# Patient Record
Sex: Female | Born: 1970 | Race: Black or African American | Hispanic: No | Marital: Single | Smoking: Never smoker
Health system: Southern US, Community
[De-identification: ages and names within clinical notes are randomized; demographics above are authoritative.]

## PROBLEM LIST (undated history)

## (undated) DIAGNOSIS — IMO0002 Reserved for concepts with insufficient information to code with codable children: Secondary | ICD-10-CM

## (undated) DIAGNOSIS — D649 Anemia, unspecified: Secondary | ICD-10-CM

## (undated) DIAGNOSIS — M329 Systemic lupus erythematosus, unspecified: Secondary | ICD-10-CM

## (undated) DIAGNOSIS — J8410 Pulmonary fibrosis, unspecified: Secondary | ICD-10-CM

## (undated) DIAGNOSIS — I2781 Cor pulmonale (chronic): Secondary | ICD-10-CM

## (undated) DIAGNOSIS — N183 Chronic kidney disease, stage 3 unspecified: Secondary | ICD-10-CM

## (undated) DIAGNOSIS — J9610 Chronic respiratory failure, unspecified whether with hypoxia or hypercapnia: Secondary | ICD-10-CM

## (undated) DIAGNOSIS — M341 CR(E)ST syndrome: Secondary | ICD-10-CM

---

## 2011-10-24 ENCOUNTER — Emergency Department (HOSPITAL_COMMUNITY)
Admission: EM | Admit: 2011-10-24 | Discharge: 2011-10-25 | Disposition: A | Discharge status: Home / Self Care | Payer: Medicaid Other | Attending: Emergency Medicine | Admitting: Emergency Medicine

## 2011-10-24 ENCOUNTER — Encounter (HOSPITAL_COMMUNITY): Payer: Self-pay | Admitting: *Deleted

## 2011-10-24 ENCOUNTER — Other Ambulatory Visit: Payer: Self-pay

## 2011-10-24 ENCOUNTER — Emergency Department (HOSPITAL_COMMUNITY): Payer: Medicaid Other

## 2011-10-24 DIAGNOSIS — Z79899 Other long term (current) drug therapy: Secondary | ICD-10-CM | POA: Insufficient documentation

## 2011-10-24 DIAGNOSIS — IMO0001 Reserved for inherently not codable concepts without codable children: Secondary | ICD-10-CM | POA: Insufficient documentation

## 2011-10-24 DIAGNOSIS — J189 Pneumonia, unspecified organism: Secondary | ICD-10-CM | POA: Insufficient documentation

## 2011-10-24 DIAGNOSIS — M329 Systemic lupus erythematosus, unspecified: Secondary | ICD-10-CM | POA: Insufficient documentation

## 2011-10-24 DIAGNOSIS — R0602 Shortness of breath: Secondary | ICD-10-CM | POA: Insufficient documentation

## 2011-10-24 HISTORY — DX: Pulmonary fibrosis, unspecified: J84.10

## 2011-10-24 HISTORY — DX: Systemic lupus erythematosus, unspecified: M32.9

## 2011-10-24 HISTORY — DX: Reserved for concepts with insufficient information to code with codable children: IMO0002

## 2011-10-24 MED ORDER — IPRATROPIUM BROMIDE 0.02 % IN SOLN
0.5000 mg | Freq: Once | RESPIRATORY_TRACT | Status: AC
  Administered 2011-10-24: 0.5 mg via RESPIRATORY_TRACT
  Filled 2011-10-24: qty 2.5

## 2011-10-24 MED ORDER — ALBUTEROL SULFATE (5 MG/ML) 0.5% IN NEBU
5.0000 mg | INHALATION_SOLUTION | Freq: Once | RESPIRATORY_TRACT | Status: AC
  Administered 2011-10-24: 5 mg via RESPIRATORY_TRACT
  Filled 2011-10-24: qty 1

## 2011-10-24 NOTE — ED Notes (Signed)
The pt with pulmonary fibrosis  couhging for one month and she has had more sob and some intermittent chest pain for one week

## 2011-10-25 LAB — DIFFERENTIAL
Basophils Absolute: 0 10*3/uL (ref 0.0–0.1)
Basophils Relative: 0 % (ref 0–1)
Eosinophils Absolute: 0 10*3/uL (ref 0.0–0.7)
Eosinophils Relative: 0 % (ref 0–5)
Lymphocytes Relative: 18 % (ref 12–46)
Lymphs Abs: 2.1 10*3/uL (ref 0.7–4.0)
Monocytes Absolute: 0.8 10*3/uL (ref 0.1–1.0)
Monocytes Relative: 7 % (ref 3–12)
Neutro Abs: 8.8 10*3/uL — ABNORMAL HIGH (ref 1.7–7.7)
Neutrophils Relative %: 74 % (ref 43–77)

## 2011-10-25 LAB — BASIC METABOLIC PANEL
BUN: 16 mg/dL (ref 6–23)
CO2: 28 mEq/L (ref 19–32)
Calcium: 9.2 mg/dL (ref 8.4–10.5)
Chloride: 103 mEq/L (ref 96–112)
Creatinine, Ser: 0.9 mg/dL (ref 0.50–1.10)
GFR calc Af Amer: >90 mL/min (ref >90)
GFR calc non Af Amer: 78 mL/min — ABNORMAL LOW (ref >90)
Glucose, Bld: 122 mg/dL — ABNORMAL HIGH (ref 70–99)
Potassium: 3.9 mEq/L (ref 3.5–5.1)
Sodium: 141 mEq/L (ref 135–145)

## 2011-10-25 LAB — CBC
HCT: 40.1 % (ref 36.0–46.0)
Hemoglobin: 13.3 g/dL (ref 12.0–15.0)
MCH: 24.9 pg — ABNORMAL LOW (ref 26.0–34.0)
MCHC: 33.2 g/dL (ref 30.0–36.0)
MCV: 75 fL — ABNORMAL LOW (ref 78.0–100.0)
Platelets: 271 10*3/uL (ref 150–400)
RBC: 5.35 MIL/uL — ABNORMAL HIGH (ref 3.87–5.11)
RDW: 17.1 % — ABNORMAL HIGH (ref 11.5–15.5)
WBC: 11.8 10*3/uL — ABNORMAL HIGH (ref 4.0–10.5)

## 2011-10-25 LAB — TROPONIN I: Troponin I: <0.3 ng/mL (ref <0.30)

## 2011-10-25 LAB — PRO B NATRIURETIC PEPTIDE: Pro B Natriuretic peptide (BNP): 337.4 pg/mL — ABNORMAL HIGH (ref 0–125)

## 2011-10-25 MED ORDER — MOXIFLOXACIN HCL 400 MG PO TABS: 400.0000 mg | ORAL_TABLET | Freq: Every day | ORAL | Status: AC

## 2011-10-25 MED ORDER — HYDROCODONE-ACETAMINOPHEN 5-325 MG PO TABS: 2.0000 | ORAL_TABLET | ORAL | Status: AC | PRN

## 2011-10-25 MED ORDER — HYDROCODONE-ACETAMINOPHEN 5-325 MG PO TABS
1.0000 | ORAL_TABLET | Freq: Once | ORAL | Status: AC
  Administered 2011-10-25: 1 via ORAL

## 2011-10-25 MED ORDER — HYDROCOD POLST-CHLORPHEN POLST 10-8 MG/5ML PO LQCR: 5.0000 mL | Freq: Two times a day (BID) | ORAL | Status: DC | PRN

## 2011-10-25 MED ORDER — HYDROCODONE-ACETAMINOPHEN 5-325 MG PO TABS
2.0000 | ORAL_TABLET | Freq: Once | ORAL | Status: DC
  Filled 2011-10-25: qty 2

## 2011-10-25 MED ORDER — MOXIFLOXACIN HCL IN NACL 400 MG/250ML IV SOLN: 400.0000 mg | Freq: Once | INTRAVENOUS | Status: DC

## 2011-10-25 MED ORDER — SODIUM CHLORIDE 0.9 % IV SOLN
INTRAVENOUS | Status: DC
  Administered 2011-10-25: 01:00:00 via INTRAVENOUS

## 2011-10-25 MED ORDER — ANTIPYRINE-BENZOCAINE 5.4-1.4 % OT SOLN: 3.0000 [drp] | Freq: Once | OTIC | Status: DC

## 2011-10-25 MED ORDER — MOXIFLOXACIN HCL 400 MG PO TABS
ORAL_TABLET | ORAL | Status: AC
  Administered 2011-10-25: 400 mg
  Filled 2011-10-25: qty 1

## 2011-10-25 MED ORDER — HYDROCOD POLST-CHLORPHEN POLST 10-8 MG/5ML PO LQCR
5.0000 mL | Freq: Once | ORAL | Status: AC
  Administered 2011-10-25: 5 mL via ORAL
  Filled 2011-10-25: qty 5

## 2011-10-25 MED ORDER — DEXTROSE 5 % IV SOLN
1.0000 g | Freq: Once | INTRAVENOUS | Status: AC
  Administered 2011-10-25: 1 g via INTRAVENOUS
  Filled 2011-10-25: qty 10

## 2011-10-25 MED ORDER — IBUPROFEN 800 MG PO TABS
800.0000 mg | ORAL_TABLET | Freq: Once | ORAL | Status: AC
  Administered 2011-10-25: 800 mg via ORAL
  Filled 2011-10-25: qty 1

## 2011-10-25 NOTE — Discharge Instructions (Signed)
Please review the instructions below. You were seen in the emergency department tonight for your persistent cough. The chest x-ray tonight shows that you do have a pneumonia in your right lower lung. We are treating the pneumonia with an antibiotic. You will take one antibiotic tablet daily. We have also prescribed a short course of medication for cough and pain. Please take as directed. Be aware that the medication for cough and hydrocodone/APAP can make you sleepy. Avoid taking them together. Also be aware that the hydrocodone/APAP contains Tylenol. Avoid taking this medication with extra doses of Tylenol. Continue your inhalers, neb treatments and home oxygen use as previously instructed by your pulmonologist. Call today to arrange follow up with your pulmonologist in 2 days. If you're unable to arrange follow up with the pulmonologist at [facility] in 2-3 days then follow up with your primary care physician here [place]. Return for worsening symptoms otherwise follow up as discussed.   Pneumonia, Adult Pneumonia is an infection of the lungs. It may be caused by a germ (virus or bacteria). Some types of pneumonia can spread easily from person to person. This can happen when you cough or sneeze. HOME CARE  Only take medicine as told by your doctor.   Take your medicine (antibiotics) as told. Finish it even if you start to feel better.   Do not smoke.   You may use a vaporizer or humidifier in your room. This can help loosen thick spit (mucus).   Sleep so you are almost sitting up (semi-upright). This helps reduce coughing.   Rest.  A shot (vaccine) can help prevent pneumonia. Shots are often advised for:  People over 65 years old.   Patients on chemotherapy.   People with long-term (chronic) lung problems.   People with immune system problems.  GET HELP RIGHT AWAY IF:   You are getting worse.   You cannot control your cough, and you are losing sleep.   You cough up blood.   Your  pain gets worse, even with medicine.   You have a fever.   Any of your problems are getting worse, not better.   You have shortness of breath or chest pain.  MAKE SURE YOU:   Understand these instructions.   Will watch your condition.   Will get help right away if you are not doing well or get worse.  Document Released: 12/29/2007 Document Revised: 07/01/2011 Document Reviewed: 10/02/2010 [facility] Patient Information 2012 [place].Pneumonia, Adult Pneumonia is an infection of the lungs. It may be caused by a germ (virus or bacteria). Some types of pneumonia can spread easily from person to person. This can happen when you cough or sneeze. HOME CARE  Only take medicine as told by your doctor.   Take your medicine (antibiotics) as told. Finish it even if you start to feel better.   Do not smoke.   You may use a vaporizer or humidifier in your room. This can help loosen thick spit (mucus).   Sleep so you are almost sitting up (semi-upright). This helps reduce coughing.   Rest.  A shot (vaccine) can help prevent pneumonia. Shots are often advised for:  People over 89 years old.   Patients on chemotherapy.   People with long-term (chronic) lung problems.   People with immune system problems.  GET HELP RIGHT AWAY IF:   You are getting worse.   You cannot control your cough, and you are losing sleep.   You cough up blood.   Your  pain gets worse, even with medicine.   You have a fever.   Any of your problems are getting worse, not better.   You have shortness of breath or chest pain.  MAKE SURE YOU:   Understand these instructions.   Will watch your condition.   Will get help right away if you are not doing well or get worse.  Document Released: 12/29/2007 Document Revised: 07/01/2011 Document Reviewed: 10/02/2010 [facility] Patient Information 2012 [place].

## 2011-10-25 NOTE — ED Notes (Signed)
Patient with expiratory wheezing.  Patient states she has had a cough for about one month, with wheezing in the last week, getting worse tonight.  Denies any fever.  Patient with oxygen saturation of 89% on room air, placed on oxygen via [place] at 2L with increasing oxygen saturation to 100%.

## 2011-10-25 NOTE — ED Provider Notes (Signed)
History     CSN: [id number]  Arrival date & time 10/24/11  2234   First MD Initiated Contact with Patient 10/24/11 2343      Chief Complaint  Patient presents with  . Shortness of Breath    HPI: Patient is a 41 y.o. female presenting with cough. The history is provided by the patient.  Cough This is a recurrent problem. The current episode started more than 1 week ago. The problem has been gradually worsening. The cough is productive of sputum. There has been no fever. Associated symptoms include myalgias and shortness of breath. Pertinent negatives include no chills, no sweats and no weight loss. She has tried cough syrup and mist for the symptoms. The treatment provided no relief. She is not a smoker.  Pt reports 1 mo hx of increasing cough. Pt w/ hx of Lupus and Pulmonary Fibrosis and has chronic cough anyway so initially thought little of it. Over the last 3-5 days pt has had worsening cough that often results in post-tussive vomiting and related SOB. Has noted mild increase in fatigue and bodyaches but no fever. States she has coughed so often that she has persistent chest wall pain (worse w/ cough and deep inhalation). Pt admits she has been told in past that she had "enlarged heart". Her doctors at [facility] performed "lots of tests" including a cardiac cath which did not find any "heart problems". Is on home 02 at night and uses it PRN during the day. States she rarely has to use during the day. Pt states she was at [facility] all day waiting on a niece to have a baby and her coughing became so persistent she drove here for eval.  Past Medical History  Diagnosis Date  . Lupus   . Fibrosis of lung     History reviewed. No pertinent past surgical history.  History reviewed. No pertinent family history.  History  Substance Use Topics  . Smoking status: Never Smoker   . Smokeless tobacco: Not on file  . Alcohol Use: No    OB History    Grav Para Term Preterm Abortions TAB SAB Ect Mult  Living                  Review of Systems  Constitutional: Negative.  Negative for fever, chills and weight loss.  HENT: Negative.   Eyes: Negative.   Respiratory: Positive for cough and shortness of breath.   Cardiovascular: Negative.   Gastrointestinal: Negative.   Genitourinary: Negative.   Musculoskeletal: Positive for myalgias.  Skin: Negative.   Neurological: Negative.   Hematological: Negative.   Psychiatric/Behavioral: Negative.     Allergies  Review of patient's allergies indicates not on file.  Home Medications   Current Outpatient Rx  Name Route Sig Dispense Refill  . ALBUTEROL SULFATE HFA 108 (90 BASE) MCG/ACT IN AERS Inhalation Inhale 2 puffs into the lungs every 6 (six) hours as needed. For shortness of breath    . ALBUTEROL SULFATE (2.5 MG/3ML) 0.083% IN NEBU Nebulization Take 2.5 mg by nebulization every 6 (six) hours as needed. For shortness of breath    . BECLOMETHASONE DIPROPIONATE 80 MCG/ACT IN AERS Inhalation Inhale 1 puff into the lungs 2 (two) times daily as needed. For shortness of  breath    . CALCIUM CARBONATE 1250 MG PO TABS Oral Take 1 tablet by mouth 2 (two) times daily.    Marland Kitchen CETIRIZINE HCL 10 MG PO TABS Oral Take 10 mg by mouth daily.    Marland Kitchen  DESONIDE 0.05 % EX CREA Topical Apply 1 application topically 2 (two) times daily.    . FUROSEMIDE 80 MG PO TABS Oral Take 80 mg by mouth 2 (two) times daily.    Marland Kitchen HYDROCHLOROTHIAZIDE 25 MG PO TABS Oral Take 25 mg by mouth daily.    Marland Kitchen HYDROXYCHLOROQUINE SULFATE 200 MG PO TABS Oral Take by mouth 2 (two) times daily.    . IRON FORMULA PO Oral Take 1 tablet by mouth daily.    Marland Kitchen MONTELUKAST SODIUM 10 MG PO TABS Oral Take 10 mg by mouth daily.    Marland Kitchen PANTOPRAZOLE SODIUM 40 MG PO TBEC Oral Take 40 mg by mouth daily.    Marland Kitchen POTASSIUM CHLORIDE CRYS ER 20 MEQ PO TBCR Oral Take 20 mEq by mouth daily.    Marland Kitchen TACROLIMUS 0.1 % EX OINT Topical Apply 1 application topically 2 (two) times daily.    Marland Kitchen HYDROCOD POLST-CPM POLST ER 10-8  MG/5ML PO LQCR Oral Take 5 mLs by mouth every 12 (twelve) hours as needed. 30 mL 0  . HYDROCODONE-ACETAMINOPHEN 5-325 MG PO TABS Oral Take 2 tablets by mouth every 4 (four) hours as needed for pain. 10 tablet 0  . MOXIFLOXACIN HCL 400 MG PO TABS Oral Take 1 tablet (400 mg total) by mouth daily. 6 tablet 0    BP 99/60  Pulse 85  Temp(Src) 98.8 F (37.1 C) (Oral)  Resp 16  SpO2 99%  LMP 10/03/2011  Physical Exam  Constitutional: She is oriented to person, place, and time. She appears well-developed and well-nourished.  HENT:  Head: Normocephalic and atraumatic.  Eyes: Conjunctivae are normal.  Neck: Neck supple.  Cardiovascular: Normal rate and regular rhythm.   Pulmonary/Chest: Effort normal. Not tachypneic. No respiratory distress.       BBS diminished bil w/ mild exp wheezes bil  R>L.   Abdominal: Soft. Bowel sounds are normal.  Musculoskeletal: Normal range of motion.  Neurological: She is alert and oriented to person, place, and time.  Skin: Skin is warm and dry. No erythema.  Psychiatric: She has a normal mood and affect.    ED Course  Procedures   Date: 03/0312013  Rate: 95  Rhythm: normal sinus rhythm  QRS Axis: normal  Intervals: normal  ST/T Wave abnormalities: normal  Conduction Disutrbances:none  Narrative Interpretation:   Old EKG Reviewed: none available  Pt reports breathing much improved w/ neb. BBS remained diminished but w/o exp wheezes. 02 sats now 96% at rest. EKG normal, Trop I negative, CXR findings c/w RLL PNA. Pt ambulated by me w/ continuous 02 sat monitoring. During ambulation in dept pt noted w/ 02 sats at 93-94%. She tolerated ambulation w/o increased SOB or tachycardia and had rapid return to baseline 02 sat of 96% when seated.  Findings and clinical impression discussed w/ pt. Will plan for d/c home w/ Tx for CAP. Pt instructed to arrange f/u w/ her pulmonologist in 2 to 3 days. If she is unable to get in that soon she should arrange f/u w/ her  local PCP for a recheck. Pt is agreeable w/ plan.  I have discussed pt w/ Dr. Venora Maples who is agreeable w/ plan.    Labs Reviewed  CBC - Abnormal; Notable for the following:    WBC 11.8 (*)    RBC 5.35 (*)    MCV 75.0 (*)    MCH 24.9 (*)    RDW 17.1 (*)    All other components within normal limits  DIFFERENTIAL - Abnormal;  Notable for the following:    Neutro Abs 8.8 (*)    All other components within normal limits  BASIC METABOLIC PANEL - Abnormal; Notable for the following:    Glucose, Bld 122 (*)    GFR calc non Af Amer 78 (*)    All other components within normal limits  PRO B NATRIURETIC PEPTIDE - Abnormal; Notable for the following:    Pro B Natriuretic peptide (BNP) 337.4 (*)    All other components within normal limits  TROPONIN I   Dg Chest 2 View  10/24/2011  *RADIOLOGY REPORT*  Clinical Data: Persistent cough and shortness of breath for 1 week.  CHEST - 2 VIEW  Comparison: None.  Findings: Mild cardiac enlargement with relatively normal pulmonary vascularity.  There is suggestion of focal infiltration in the right lung base which might represent early pneumonia or edema.  No blunting of costophrenic angles.  No pneumothorax.  IMPRESSION: Focal airspace infiltration in the right lung base suggesting pneumonia or edema.  Mild cardiac enlargement.  Original Report Authenticated By: Neale Burly, M.D.     1. Community acquired pneumonia       MDM  HPI/PE and clinical findings c/w 1. CAP (Pt w/ chronic lung disease (pulmonary fibrosis) and Lupus w/  increased cough x 1 mo, worse over 3-4 days. No fever.  Pt initially mildly tachycardic (111)  w/ 02 sat of 95%. Tachycardia resolved and 02 sats improved after one albuterol/atrovent neb. PE considered but not likely given pt's improved status after neb and clear source for resp symptoms. EKG normal and Trop I negative. Pt reports feeling better after neb and is agreeable w/ discharge plan.         Jeryl Columbia, NP 10/27/11 [phone]

## 2011-10-25 NOTE — ED Notes (Signed)
Pt given RX for Avelox, per pharmacy not covered by insurance. RX changed to Levaquin 750 mg PO daily x five day by Charlotte Sanes PA. RX called to [facility].

## 2011-10-27 NOTE — ED Provider Notes (Signed)
Medical screening examination/treatment/procedure(s) were conducted as a shared visit with non-physician practitioner(s) and myself.  I personally evaluated the patient during the encounter   Hoy Morn, MD 10/27/11 2158

## 2013-12-13 ENCOUNTER — Ambulatory Visit: Payer: Medicaid Other | Attending: Family Medicine | Admitting: Physical Therapy

## 2013-12-13 DIAGNOSIS — M329 Systemic lupus erythematosus, unspecified: Secondary | ICD-10-CM | POA: Insufficient documentation

## 2013-12-13 DIAGNOSIS — J8410 Pulmonary fibrosis, unspecified: Secondary | ICD-10-CM | POA: Diagnosis not present

## 2013-12-13 DIAGNOSIS — M6281 Muscle weakness (generalized): Secondary | ICD-10-CM | POA: Insufficient documentation

## 2013-12-13 DIAGNOSIS — IMO0001 Reserved for inherently not codable concepts without codable children: Secondary | ICD-10-CM | POA: Insufficient documentation

## 2015-01-11 ENCOUNTER — Emergency Department (HOSPITAL_BASED_OUTPATIENT_CLINIC_OR_DEPARTMENT_OTHER)
Admission: EM | Admit: 2015-01-11 | Discharge: 2015-01-12 | Disposition: A | Discharge status: Home / Self Care | Payer: Medicaid Other | Attending: Emergency Medicine | Admitting: Emergency Medicine

## 2015-01-11 ENCOUNTER — Encounter (HOSPITAL_BASED_OUTPATIENT_CLINIC_OR_DEPARTMENT_OTHER): Payer: Self-pay | Admitting: Emergency Medicine

## 2015-01-11 DIAGNOSIS — R0600 Dyspnea, unspecified: Secondary | ICD-10-CM | POA: Diagnosis not present

## 2015-01-11 DIAGNOSIS — R202 Paresthesia of skin: Secondary | ICD-10-CM | POA: Insufficient documentation

## 2015-01-11 DIAGNOSIS — Z8709 Personal history of other diseases of the respiratory system: Secondary | ICD-10-CM

## 2015-01-11 DIAGNOSIS — R0602 Shortness of breath: Secondary | ICD-10-CM | POA: Insufficient documentation

## 2015-01-11 DIAGNOSIS — Z7952 Long term (current) use of systemic steroids: Secondary | ICD-10-CM | POA: Diagnosis not present

## 2015-01-11 DIAGNOSIS — R200 Anesthesia of skin: Secondary | ICD-10-CM | POA: Diagnosis not present

## 2015-01-11 DIAGNOSIS — M7989 Other specified soft tissue disorders: Secondary | ICD-10-CM | POA: Insufficient documentation

## 2015-01-11 DIAGNOSIS — R600 Localized edema: Secondary | ICD-10-CM | POA: Diagnosis not present

## 2015-01-11 DIAGNOSIS — M791 Myalgia: Secondary | ICD-10-CM | POA: Insufficient documentation

## 2015-01-11 DIAGNOSIS — Z8739 Personal history of other diseases of the musculoskeletal system and connective tissue: Secondary | ICD-10-CM | POA: Diagnosis not present

## 2015-01-11 DIAGNOSIS — R29810 Facial weakness: Secondary | ICD-10-CM | POA: Diagnosis present

## 2015-01-11 DIAGNOSIS — Z79899 Other long term (current) drug therapy: Secondary | ICD-10-CM | POA: Insufficient documentation

## 2015-01-11 NOTE — ED Notes (Signed)
Pt states facial droop and swelling to right side of face and states that her face "just twists up and goes from side to side, sometimes its the left side of my face."  Pt also states tonight she is having right arm pain and that her muscles were clenching into a fist in her right hand, she would open the fist, then it would want to close again.

## 2015-01-12 ENCOUNTER — Emergency Department (HOSPITAL_BASED_OUTPATIENT_CLINIC_OR_DEPARTMENT_OTHER): Payer: Medicaid Other

## 2015-01-12 LAB — COMPREHENSIVE METABOLIC PANEL
ALT: 16 U/L (ref 14–54)
ANION GAP: 14 (ref 5–15)
AST: 24 U/L (ref 15–41)
Albumin: 4 g/dL (ref 3.5–5.0)
Alkaline Phosphatase: 45 U/L (ref 38–126)
BILIRUBIN TOTAL: 0.5 mg/dL (ref 0.3–1.2)
BUN: 33 mg/dL — AB (ref 6–20)
CHLORIDE: 97 mmol/L — AB (ref 101–111)
CO2: 22 mmol/L (ref 22–32)
Calcium: 9.3 mg/dL (ref 8.9–10.3)
Creatinine, Ser: 1.44 mg/dL — ABNORMAL HIGH (ref 0.44–1.00)
GFR calc Af Amer: 50 mL/min — ABNORMAL LOW (ref >60)
GFR calc non Af Amer: 43 mL/min — ABNORMAL LOW (ref >60)
Glucose, Bld: 128 mg/dL — ABNORMAL HIGH (ref 65–99)
Potassium: 5.4 mmol/L — ABNORMAL HIGH (ref 3.5–5.1)
Sodium: 133 mmol/L — ABNORMAL LOW (ref 135–145)
TOTAL PROTEIN: 7.6 g/dL (ref 6.5–8.1)

## 2015-01-12 LAB — CBC WITH DIFFERENTIAL/PLATELET
Basophils Absolute: 0 K/uL (ref 0.0–0.1)
Basophils Relative: 0 % (ref 0–1)
Eosinophils Absolute: 0.1 K/uL (ref 0.0–0.7)
Eosinophils Relative: 1 % (ref 0–5)
HCT: 32.7 % — ABNORMAL LOW (ref 36.0–46.0)
Hemoglobin: 10 g/dL — ABNORMAL LOW (ref 12.0–15.0)
Lymphocytes Relative: 12 % (ref 12–46)
Lymphs Abs: 1.3 K/uL (ref 0.7–4.0)
MCH: 24.4 pg — ABNORMAL LOW (ref 26.0–34.0)
MCHC: 30.6 g/dL (ref 30.0–36.0)
MCV: 79.8 fL (ref 78.0–100.0)
Monocytes Absolute: 1.1 K/uL — ABNORMAL HIGH (ref 0.1–1.0)
Monocytes Relative: 10 % (ref 3–12)
Neutro Abs: 8.4 K/uL — ABNORMAL HIGH (ref 1.7–7.7)
Neutrophils Relative %: 77 % (ref 43–77)
Platelets: 416 K/uL — ABNORMAL HIGH (ref 150–400)
RBC: 4.1 MIL/uL (ref 3.87–5.11)
RDW: 17.6 % — ABNORMAL HIGH (ref 11.5–15.5)
WBC: 11 K/uL — ABNORMAL HIGH (ref 4.0–10.5)

## 2015-01-12 LAB — TROPONIN I: Troponin I: <0.03 ng/mL (ref <0.031)

## 2015-01-12 LAB — BRAIN NATRIURETIC PEPTIDE: B Natriuretic Peptide: 255.7 pg/mL — ABNORMAL HIGH (ref 0.0–100.0)

## 2015-01-12 NOTE — ED Provider Notes (Signed)
CSN: [id number]     Arrival date & time 01/11/15  2339 History  This chart was scribed for Christine Speak, MD by Meriel Pica, ED Scribe. This patient was seen in room MH05/MH05 and the patient's care was started 12:17 AM.  Chief Complaint  Patient presents with  . Facial Droop   HPI HPI Comments: Christine Khan is a 44 y.o. female, with a Pmhx of Lupus, CHF, and Pulmonary Fibrosis, who presents to the Emergency Department complaining of gradually worsening, constant, moderate numbness and twitching of her right-sided face and mouth that has been present but worsened today. She also notes that her right hand will clench and lock up and she has to keep prying it open. She additionally complains of numbness in her legs and feet bilaterally and increased swelling in her BLE. Per family members she has had increased SOB onset tonight. She denies feeling all symptoms similar in the past. She recently started a new breathing medication since 5/19. She denies a history of renal failure.    Past Medical History  Diagnosis Date  . Lupus   . Fibrosis of lung    History reviewed. No pertinent past surgical history. History reviewed. No pertinent family history. History  Substance Use Topics  . Smoking status: Never Smoker   . Smokeless tobacco: Not on file  . Alcohol Use: No   OB History    No data available     Review of Systems  Respiratory: Positive for shortness of breath.   Cardiovascular: Positive for leg swelling.  Musculoskeletal: Positive for myalgias.  Neurological: Positive for numbness.   A complete 10 system review of systems was obtained and is otherwise negative except at noted in the HPI and PMH.   Allergies  Heparin  Home Medications   Prior to Admission medications   Medication Sig Start Date End Date Taking? Authorizing Provider  albuterol (PROVENTIL HFA;VENTOLIN HFA) 108 (90 BASE) MCG/ACT inhaler Inhale 2 puffs into the lungs every 6 (six) hours as needed. For  shortness of breath    Historical Provider, MD  albuterol (PROVENTIL) (2.5 MG/3ML) 0.083% nebulizer solution Take 2.5 mg by nebulization every 6 (six) hours as needed. For shortness of breath    Historical Provider, MD  beclomethasone (QVAR) 80 MCG/ACT inhaler Inhale 1 puff into the lungs 2 (two) times daily as needed. For shortness of  breath    Historical Provider, MD  calcium carbonate (CALCIUM 500) 1250 MG tablet Take 1 tablet by mouth 2 (two) times daily.    Historical Provider, MD  cetirizine (ZYRTEC) 10 MG tablet Take 10 mg by mouth daily.    Historical Provider, MD  chlorpheniramine-HYDROcodone (TUSSIONEX PENNKINETIC ER) 10-8 MG/5ML LQCR Take 5 mLs by mouth every 12 (twelve) hours as needed. 10/25/11   Rhetta Mura Schorr, NP  desonide (DESOWEN) 0.05 % cream Apply 1 application topically 2 (two) times daily.    Historical Provider, MD  furosemide (LASIX) 80 MG tablet Take 80 mg by mouth 2 (two) times daily.    Historical Provider, MD  hydrochlorothiazide (HYDRODIURIL) 25 MG tablet Take 25 mg by mouth daily.    Historical Provider, MD  hydroxychloroquine (PLAQUENIL) 200 MG tablet Take by mouth 2 (two) times daily.    Historical Provider, MD  Iron-Folic Acid-Vit G81 (IRON FORMULA PO) Take 1 tablet by mouth daily.    Historical Provider, MD  montelukast (SINGULAIR) 10 MG tablet Take 10 mg by mouth daily.    Historical Provider, MD  pantoprazole (PROTONIX) 40 MG  tablet Take 40 mg by mouth daily.    Historical Provider, MD  potassium chloride SA (K-DUR,KLOR-CON) 20 MEQ tablet Take 20 mEq by mouth daily.    Historical Provider, MD  tacrolimus (PROTOPIC) 0.1 % ointment Apply 1 application topically 2 (two) times daily.    Historical Provider, MD   BP 111/90 mmHg  Pulse 91  Temp(Src) 98.4 F (36.9 C) (Oral)  Resp 20  Ht _0  (1.6 m)  Wt 281 lb (127.461 kg)  BMI 49.79 kg/m2  SpO2 100% Physical Exam  Constitutional: She is oriented to person, place, and time. She appears well-developed and  well-nourished. No distress.  HENT:  Head: Normocephalic.  Right Ear: External ear normal.  Left Ear: External ear normal.  Mouth/Throat: No oropharyngeal exudate.  Eyes: Pupils are equal, round, and reactive to light. Right eye exhibits no discharge. Left eye exhibits no discharge. No scleral icterus.  Neck: No JVD present.  Cardiovascular: Normal rate, regular rhythm and normal heart sounds.   Pulmonary/Chest: Effort normal and breath sounds normal. No respiratory distress.  Musculoskeletal: She exhibits edema. She exhibits no tenderness.  Lymphadenopathy:    She has no cervical adenopathy.  Neurological: She is alert and oriented to person, place, and time. No cranial nerve deficit. Coordination normal.  Skin: Skin is warm. No rash noted. No erythema. No pallor.  Psychiatric: She has a normal mood and affect. Her behavior is normal.  Nursing note and vitals reviewed.   ED Course  Procedures  DIAGNOSTIC STUDIES: Oxygen Saturation is 100% on RA, normal by my interpretation.    COORDINATION OF CARE: 12:24 AM Discussed treatment plan which includes to order diagnostic labs with pt. Pt acknowledges and agrees to plan.   Labs Review Labs Reviewed - No data to display  Imaging Review No results found.   EKG Interpretation   Date/Time:  Sunday January 12 2015 02:06:48 EDT Ventricular Rate:  107 PR Interval:  170 QRS Duration: 78 QT Interval:  332 QTC Calculation: 443 R Axis:   96 Text Interpretation:  Sinus tachycardia Possible Left atrial enlargement  Possible Right ventricular hypertrophy Abnormal ECG Confirmed by Dquan Cortopassi  MD,  Assunta Pupo ([id number]) on 01/12/2015 2:13:52 AM      MDM   Final diagnoses:  None    Patient presents here with numbness around her mouth, spasms in her hand, and tingling in her legs. Her symptoms sound much like anxiety, however she does have multiple comorbidities including lupus and pulmonary fibrosis. The workup shows essentially unremarkable  electrolytes, mildly elevated BNP, unchanged EKG, negative troponin, and chest x-ray that is consistent with pulmonary fibrosis. At this point I see no emergent issues that require hospitalization or intervention. I will advise the patient to follow-up with her primary Dr. and give this situation time to improve on its own. If she worsens, she understands to return.  I personally performed the services described in this documentation, which was scribed in my presence. The recorded information has been reviewed and is accurate.      Christine Speak, MD 01/12/15 [phone]

## 2015-01-12 NOTE — Discharge Instructions (Signed)
Follow-up with your primary Dr. in the next 2-3 days, and return to the ER if symptoms significantly worsen or change.   Paresthesia Paresthesia is an abnormal burning or prickling sensation. This sensation is generally felt in the hands, arms, legs, or feet. However, it may occur in any part of the body. It is usually not painful. The feeling may be described as:  Tingling or numbness.  "Pins and needles."  Skin crawling.  Buzzing.  Limbs "falling asleep."  Itching. Most people experience temporary (transient) paresthesia at some time in their lives. CAUSES  Paresthesia may occur when you breathe too quickly (hyperventilation). It can also occur without any apparent cause. Commonly, paresthesia occurs when pressure is placed on a nerve. The feeling quickly goes away once the pressure is removed. For some people, however, paresthesia is a long-lasting (chronic) condition caused by an underlying disorder. The underlying disorder may be:  A traumatic, direct injury to nerves. Examples include a:  Broken (fractured) neck.  Fractured skull.  A disorder affecting the brain and spinal cord (central nervous system). Examples include:  Transverse myelitis.  Encephalitis.  Transient ischemic attack.  Multiple sclerosis.  Stroke.  Tumor or blood vessel problems, such as an arteriovenous malformation pressing against the brain or spinal cord.  A condition that damages the peripheral nerves (peripheral neuropathy). Peripheral nerves are not part of the brain and spinal cord. These conditions include:  Diabetes.  Peripheral vascular disease.  Nerve entrapment syndromes, such as carpal tunnel syndrome.  Shingles.  Hypothyroidism.  Vitamin B12 deficiencies.  Alcoholism.  Heavy metal poisoning (lead, arsenic).  Rheumatoid arthritis.  Systemic lupus erythematosus. DIAGNOSIS  Your caregiver will attempt to find the underlying cause of your paresthesia. Your caregiver  may:  Take your medical history.  Perform a physical exam.  Order various lab tests.  Order imaging tests. TREATMENT  Treatment for paresthesia depends on the underlying cause. HOME CARE INSTRUCTIONS  Avoid drinking alcohol.  You may consider massage or acupuncture to help relieve your symptoms.  Keep all follow-up appointments as directed by your caregiver. SEEK IMMEDIATE MEDICAL CARE IF:   You feel weak.  You have trouble walking or moving.  You have problems with speech or vision.  You feel confused.  You cannot control your bladder or bowel movements.  You feel numbness after an injury.  You faint.  Your burning or prickling feeling gets worse when walking.  You have pain, cramps, or dizziness.  You develop a rash. MAKE SURE YOU:  Understand these instructions.  Will watch your condition.  Will get help right away if you are not doing well or get worse. Document Released: 07/02/2002 Document Revised: 10/04/2011 Document Reviewed: 04/02/2011 [facility] Patient Information 2015 [place], [place]. This information is not intended to replace advice given to you by your health care provider. Make sure you discuss any questions you have with your health care provider.

## 2015-02-23 ENCOUNTER — Emergency Department (HOSPITAL_BASED_OUTPATIENT_CLINIC_OR_DEPARTMENT_OTHER): Payer: Medicaid Other

## 2015-02-23 ENCOUNTER — Emergency Department (HOSPITAL_BASED_OUTPATIENT_CLINIC_OR_DEPARTMENT_OTHER)
Admission: EM | Admit: 2015-02-23 | Discharge: 2015-02-24 | Disposition: A | Discharge status: Home / Self Care | Payer: Medicaid Other | Attending: Emergency Medicine | Admitting: Emergency Medicine

## 2015-02-23 ENCOUNTER — Encounter (HOSPITAL_BASED_OUTPATIENT_CLINIC_OR_DEPARTMENT_OTHER): Payer: Self-pay | Admitting: *Deleted

## 2015-02-23 DIAGNOSIS — R0600 Dyspnea, unspecified: Secondary | ICD-10-CM | POA: Diagnosis not present

## 2015-02-23 DIAGNOSIS — Z7952 Long term (current) use of systemic steroids: Secondary | ICD-10-CM | POA: Diagnosis not present

## 2015-02-23 DIAGNOSIS — R000 Tachycardia, unspecified: Secondary | ICD-10-CM | POA: Diagnosis not present

## 2015-02-23 DIAGNOSIS — Z86018 Personal history of other benign neoplasm: Secondary | ICD-10-CM | POA: Insufficient documentation

## 2015-02-23 DIAGNOSIS — N390 Urinary tract infection, site not specified: Secondary | ICD-10-CM | POA: Diagnosis not present

## 2015-02-23 DIAGNOSIS — M791 Myalgia: Secondary | ICD-10-CM | POA: Diagnosis not present

## 2015-02-23 DIAGNOSIS — M2550 Pain in unspecified joint: Secondary | ICD-10-CM | POA: Diagnosis not present

## 2015-02-23 DIAGNOSIS — R531 Weakness: Secondary | ICD-10-CM | POA: Insufficient documentation

## 2015-02-23 DIAGNOSIS — R197 Diarrhea, unspecified: Secondary | ICD-10-CM | POA: Insufficient documentation

## 2015-02-23 DIAGNOSIS — R0602 Shortness of breath: Secondary | ICD-10-CM | POA: Insufficient documentation

## 2015-02-23 DIAGNOSIS — Z79899 Other long term (current) drug therapy: Secondary | ICD-10-CM | POA: Diagnosis not present

## 2015-02-23 LAB — CBC
HEMATOCRIT: 29.2 % — AB (ref 36.0–46.0)
HEMOGLOBIN: 8.8 g/dL — AB (ref 12.0–15.0)
MCH: 23.4 pg — ABNORMAL LOW (ref 26.0–34.0)
MCHC: 30.1 g/dL (ref 30.0–36.0)
MCV: 77.7 fL — AB (ref 78.0–100.0)
PLATELETS: 389 10*3/uL (ref 150–400)
RBC: 3.76 MIL/uL — AB (ref 3.87–5.11)
RDW: 18.6 % — ABNORMAL HIGH (ref 11.5–15.5)
WBC: 9.8 10*3/uL (ref 4.0–10.5)

## 2015-02-23 LAB — I-STAT CHEM 8, ED
BUN: 25 mg/dL — AB (ref 6–20)
CHLORIDE: 98 mmol/L — AB (ref 101–111)
CREATININE: 1.3 mg/dL — AB (ref 0.44–1.00)
Calcium, Ion: 1.13 mmol/L (ref 1.12–1.23)
Glucose, Bld: 91 mg/dL (ref 65–99)
HCT: 32 % — ABNORMAL LOW (ref 36.0–46.0)
Hemoglobin: 10.9 g/dL — ABNORMAL LOW (ref 12.0–15.0)
Potassium: 4.9 mmol/L (ref 3.5–5.1)
Sodium: 131 mmol/L — ABNORMAL LOW (ref 135–145)
TCO2: 23 mmol/L (ref 0–100)

## 2015-02-23 LAB — URINE MICROSCOPIC-ADD ON

## 2015-02-23 LAB — URINALYSIS, ROUTINE W REFLEX MICROSCOPIC
Bilirubin Urine: NEGATIVE
Glucose, UA: NEGATIVE mg/dL
HGB URINE DIPSTICK: NEGATIVE
Ketones, ur: NEGATIVE mg/dL
NITRITE: NEGATIVE
PH: 6 (ref 5.0–8.0)
Protein, ur: NEGATIVE mg/dL
Specific Gravity, Urine: 1.014 (ref 1.005–1.030)
Urobilinogen, UA: 0.2 mg/dL (ref 0.0–1.0)

## 2015-02-23 LAB — TROPONIN I: Troponin I: <0.03 ng/mL (ref <0.031)

## 2015-02-23 LAB — D-DIMER, QUANTITATIVE: D-Dimer, Quant: 0.83 ug/mL-FEU — ABNORMAL HIGH (ref 0.00–0.48)

## 2015-02-23 MED ORDER — IOHEXOL 350 MG/ML SOLN
100.0000 mL | Freq: Once | INTRAVENOUS | Status: AC | PRN
Start: 2015-02-23 — End: 2015-02-23
  Administered 2015-02-23: 100 mL via INTRAVENOUS

## 2015-02-23 MED ORDER — SODIUM CHLORIDE 0.9 % IV BOLUS (SEPSIS)
500.0000 mL | Freq: Once | INTRAVENOUS | Status: AC
  Administered 2015-02-23: 500 mL via INTRAVENOUS

## 2015-02-23 MED ORDER — MORPHINE SULFATE 4 MG/ML IJ SOLN
4.0000 mg | Freq: Once | INTRAMUSCULAR | Status: AC
  Administered 2015-02-23: 4 mg via INTRAVENOUS
  Filled 2015-02-23: qty 1

## 2015-02-23 NOTE — ED Notes (Signed)
Patients glucose was checked with i-stat chem 8. Notified RN Keli.

## 2015-02-23 NOTE — ED Notes (Signed)
Pt reports SOB, generalized body aches, diarrhea x 1 week.  Pt appears very weak and tired.

## 2015-02-23 NOTE — ED Provider Notes (Signed)
CSN: [id number]     Arrival date & time 02/23/15  2053 History  This chart was scribed for Elnora Morrison, MD by Helane Gunther, ED Scribe. This patient was seen in room MH03/MH03 and the patient's care was started at 10:27 PM.    Chief Complaint  Patient presents with  . Weakness   The history is provided by the patient. No language interpreter was used.   HPI Comments: Christine Khan is a 44 y.o. female who presents to the Emergency Department complaining of weakness onset 1 week ago. She reports associated SOB, myalgia, diarrhea, nose bleed, nausea, visual disturbances, and chills. She has not left her bed all week. She is on oxygen at home. She has a PMHx of Lupus and fibrosis of the lung, diagnosed 10 years ago and most likely due to the lupus. She denies PMHx of PE. She is not on blood thinners. She has not taken antibiotics recently. Pt denies vomiting, bloody stool, HA, and fever.   At [facility], she has seen Dr Randol Kern for Lupus and Dr Susette Racer for her pulmonary issues.  Past Medical History  Diagnosis Date  . Lupus   . Fibrosis of lung    History reviewed. No pertinent past surgical history. History reviewed. No pertinent family history. History  Substance Use Topics  . Smoking status: Never Smoker   . Smokeless tobacco: Not on file  . Alcohol Use: No   OB History    No data available     Review of Systems  Constitutional: Positive for chills. Negative for fever.  HENT: Negative for congestion.   Eyes: Negative for photophobia.  Respiratory: Positive for shortness of breath. Negative for cough.   Gastrointestinal: Positive for nausea and diarrhea.  Genitourinary: Positive for frequency.  Musculoskeletal: Positive for myalgias and arthralgias.  Neurological: Positive for weakness.  All other systems reviewed and are negative.  Allergies  Heparin  Home Medications   Prior to Admission medications   Medication Sig Start Date End Date Taking? Authorizing Provider   UNKNOWN TO PATIENT    Yes Historical Provider, MD  albuterol (PROVENTIL HFA;VENTOLIN HFA) 108 (90 BASE) MCG/ACT inhaler Inhale 2 puffs into the lungs every 6 (six) hours as needed. For shortness of breath    Historical Provider, MD  albuterol (PROVENTIL) (2.5 MG/3ML) 0.083% nebulizer solution Take 2.5 mg by nebulization every 6 (six) hours as needed. For shortness of breath    Historical Provider, MD  beclomethasone (QVAR) 80 MCG/ACT inhaler Inhale 1 puff into the lungs 2 (two) times daily as needed. For shortness of  breath    Historical Provider, MD  calcium carbonate (CALCIUM 500) 1250 MG tablet Take 1 tablet by mouth 2 (two) times daily.    Historical Provider, MD  cephALEXin (KEFLEX) 500 MG capsule Take 1 capsule (500 mg total) by mouth 2 (two) times daily. 02/24/15   Elnora Morrison, MD  cetirizine (ZYRTEC) 10 MG tablet Take 10 mg by mouth daily.    Historical Provider, MD  desonide (DESOWEN) 0.05 % cream Apply 1 application topically 2 (two) times daily.    Historical Provider, MD  furosemide (LASIX) 80 MG tablet Take 80 mg by mouth 2 (two) times daily.    Historical Provider, MD  hydrochlorothiazide (HYDRODIURIL) 25 MG tablet Take 25 mg by mouth daily.    Historical Provider, MD  hydroxychloroquine (PLAQUENIL) 200 MG tablet Take by mouth 2 (two) times daily.    Historical Provider, MD  Iron-Folic Acid-Vit G38 (IRON FORMULA PO) Take 1 tablet  by mouth daily.    Historical Provider, MD  montelukast (SINGULAIR) 10 MG tablet Take 10 mg by mouth daily.    Historical Provider, MD  pantoprazole (PROTONIX) 40 MG tablet Take 40 mg by mouth daily.    Historical Provider, MD  potassium chloride SA (K-DUR,KLOR-CON) 20 MEQ tablet Take 20 mEq by mouth daily.    Historical Provider, MD  tacrolimus (PROTOPIC) 0.1 % ointment Apply 1 application topically 2 (two) times daily.    Historical Provider, MD   BP 122/71 mmHg  Pulse 99  Temp(Src) 98.9 F (37.2 C) (Oral)  Resp 20  Ht _0  (1.6 m)  Wt 285 lb  (129.275 kg)  BMI 50.50 kg/m2  SpO2 91% Physical Exam  Constitutional: She is oriented to person, place, and time. She appears well-developed and well-nourished. No distress (deconditioned).  HENT:  Head: Normocephalic and atraumatic.  Mildly dry mucous membranes  Eyes: Conjunctivae and EOM are normal. Pupils are equal, round, and reactive to light.  Neck: Normal range of motion. Neck supple. No tracheal deviation present.  Cardiovascular: Regular rhythm.  Tachycardia present.   Borderline tachycardia  Pulmonary/Chest: Effort normal and breath sounds normal. No respiratory distress. She has no wheezes.  Few crackles at the base of the lungs  Abdominal: Soft. There is no tenderness.  No peritonitis of the abdomen  Musculoskeletal: Normal range of motion.  Bilateral swelling in the legs.  Neurological: She is alert and oriented to person, place, and time. No cranial nerve deficit.  Skin: Skin is warm and dry.  Psychiatric: She has a normal mood and affect. Her behavior is normal.  Nursing note and vitals reviewed.   ED Course  Procedures  DIAGNOSTIC STUDIES: Oxygen Saturation is 93% on 6L/min, low by my interpretation.    COORDINATION OF CARE: 10:35 PM - Discussed abnormal EKG. Discussed plans to order diagnostic studies and imaging. Pt advised of plan for treatment and pt agrees.  Labs Review Labs Reviewed  CBC - Abnormal; Notable for the following:    RBC 3.76 (*)    Hemoglobin 8.8 (*)    HCT 29.2 (*)    MCV 77.7 (*)    MCH 23.4 (*)    RDW 18.6 (*)    All other components within normal limits  URINALYSIS, ROUTINE W REFLEX MICROSCOPIC (NOT AT [facility]) - Abnormal; Notable for the following:    Leukocytes, UA SMALL (*)    All other components within normal limits  D-DIMER, QUANTITATIVE (NOT AT [facility]) - Abnormal; Notable for the following:    D-Dimer, Quant 0.83 (*)    All other components within normal limits  URINE MICROSCOPIC-ADD ON - Abnormal; Notable for the following:     Bacteria, UA MANY (*)    All other components within normal limits  I-STAT CHEM 8, ED - Abnormal; Notable for the following:    Sodium 131 (*)    Chloride 98 (*)    BUN 25 (*)    Creatinine, Ser 1.30 (*)    Hemoglobin 10.9 (*)    HCT 32.0 (*)    All other components within normal limits  URINE CULTURE  TROPONIN I  CBG MONITORING, ED  POC OCCULT BLOOD, ED    Imaging Review Dg Chest 2 View  02/23/2015   CLINICAL DATA:  Dyspnea, myalgias, weakness.  One week duration.  EXAM: CHEST  2 VIEW  COMPARISON:  01/12/2015  FINDINGS: Chronic interstitial coarsening persists without significant change. No superimposed airspace consolidation is evident. Is no effusion. There  is moderate unchanged cardiomegaly.  IMPRESSION: Diffuse interstitial coarsening without significant interval change. Moderate cardiomegaly, unchanged.   Electronically Signed   By: Andreas Newport M.D.   On: 02/23/2015 22:56   Ct Angio Chest Pe W/cm &/or Wo Cm  02/23/2015   CLINICAL DATA:  Dyspnea. Weakness, shortness of breath, myalgias, nausea and chills for 1 week. History of lupus and fibrosis.  EXAM: CT ANGIOGRAPHY CHEST WITH CONTRAST  TECHNIQUE: Multidetector CT imaging of the chest was performed using the standard protocol during bolus administration of intravenous contrast. Multiplanar CT image reconstructions and MIPs were obtained to evaluate the vascular anatomy.  CONTRAST:  152m OMNIPAQUE IOHEXOL 350 MG/ML SOLN  COMPARISON:  Radiographs earlier this day.  FINDINGS: There are no filling defects within the pulmonary arteries to suggest pulmonary embolus.  The thoracic aorta is normal in caliber without evidence of dissection. There is moderate multi chamber cardiomegaly physiologic pericardial fluid without effusion. No pleural effusion.  There is mild mediastinal adenopathy for example lower paratracheal lymph node short axis dimension of 1.7 cm. Multiple enlarged upper paratracheal lymph nodes. No hilar adenopathy.   Subpleural reticulation with bibasilar bronchiectasis, with mild bronchiectasis in right middle lobe. Subpleural fibrosis in the upper lobes. No septal thickening for findings to suggest pulmonary edema. Generally diffusely increased attenuation throughout the lung parenchyma. No confluent airspace disease. Scattered granulomas in the right lower lobe.  Patulous esophagus.  No esophageal wall thickening.  No acute abnormality in the included upper abdomen. Liver appears prominent in size.  There are no acute or suspicious osseous abnormalities.  Review of the MIP images confirms the above findings.  IMPRESSION: 1. No pulmonary embolus. 2. Peripheral fibrosis with bronchiectasis in the lower lobes and right middle lobe consistent with interstitial lung disease. Suggestion underlying diffuse ground-glass opacity throughout the lung parenchyma, also likely related to interstitial lung disease. 3. Cardiomegaly.   Electronically Signed   By: MJeb LeveringM.D.   On: 02/23/2015 23:57     EKG Interpretation None     EKG reviewed heart rate 98, right axis deviation, T-wave changes anterior mild worse than previous, no acute ST elevation, sinus MDM   Final diagnoses:  Diarrhea  General weakness  Dyspnea  UTI (lower urinary tract infection)  Hyponatremia  Patient with complicated medical history mostly secondary to lupus presents with clinical concern for dehydration/general weakness from recurrent diarrhea, no recent antibiotics.  Patient does have mild worsening shortness of breath and less activity than normal. Patient overall low risk blood clot, d-dimer positive. CT angina the chest ordered. Mild electrolyte abnormalities. IV fluids ordered, chest x-ray. [facility] had baseline oxygen nasal cannula currently.  On recheck patient mild improvement with IV fluids. Reviewed results no blood clot, blood work overall similar  patient's baseline and she has history of anemia, denies any active GI bleeding. We discussed pros and cons of transfer/admission versus [facility] and will help arrange appointment for her. Patient understands she can return for worsening symptoms.  Patient has frequency and many bacteria in the urine, culture sent, Rocephin ordered, plan to finish IV fluids and less any clinical changes outpatient follow-up Monday or Tuesday.  CT no blood clot. Pt on baseline home O2 on recheck.   Results and differential diagnosis were discussed with the patient/parent/guardian. Xrays were independently reviewed by myself.  Close follow up outpatient was discussed, comfortable  with the plan.   Medications  cefTRIAXone (ROCEPHIN) 1 g in dextrose 5 % 50 mL IVPB (not administered)  sodium chloride 0.9 % bolus 500 mL (500 mLs Intravenous New Bag/Given 02/23/15 2353)  iohexol (OMNIPAQUE) 350 MG/ML injection 100 mL (100 mLs Intravenous Contrast Given 02/23/15 2325)  morphine 4 MG/ML injection 4 mg (4 mg Intravenous Given 02/23/15 2352)    Filed Vitals:   02/23/15 2057 02/23/15 2247 02/23/15 2346  BP: 134/86 130/73 122/71  Pulse: 101 99 99  Temp: 98.9 F (37.2 C)    TempSrc: Oral    Resp: _0 Height: 5' 3" (1.6 m)    Weight: 285 lb (129.275 kg)    SpO2: 93% 93% 91%    Final diagnoses:  Diarrhea  General weakness  Dyspnea  UTI (lower urinary tract infection)       Elnora Morrison, MD 02/24/15 [phone]

## 2015-02-24 LAB — TROPONIN I: Troponin I: <0.03 ng/mL (ref <0.031)

## 2015-02-24 MED ORDER — CEPHALEXIN 500 MG PO CAPS: 500.0000 mg | ORAL_CAPSULE | Freq: Two times a day (BID) | ORAL | Status: DC

## 2015-02-24 MED ORDER — DEXTROSE 5 % IV SOLN: 1.0000 g | Freq: Once | INTRAVENOUS | Status: AC

## 2015-02-24 MED ORDER — CEFTRIAXONE SODIUM 1 G IJ SOLR
INTRAMUSCULAR | Status: AC
  Administered 2015-02-24: 1000 mg
  Filled 2015-02-24: qty 10

## 2015-02-24 NOTE — Discharge Instructions (Signed)
Follow-up closely with your specialists tomorrow. Have worsening symptoms or unable to see your doctor's return to the ER.  If you were given medicines take as directed.  If you are on coumadin or contraceptives realize their levels and effectiveness is altered by many different medicines.  If you have any reaction (rash, tongues swelling, other) to the medicines stop taking and see a physician.    If your blood pressure was elevated in the ER make sure you follow up for management with a primary doctor or return for chest pain, shortness of breath or stroke symptoms.  Please follow up as directed and return to the ER or see a physician for new or worsening symptoms.  Thank you. Filed Vitals:   02/23/15 2057 02/23/15 2247 02/23/15 2346  BP: 134/86 130/73 122/71  Pulse: 101 99 99  Temp: 98.9 F (37.2 C)    TempSrc: Oral    Resp: _0 Height: _1  (1.6 m)    Weight: 285 lb (129.275 kg)    SpO2: 93% 93% 91%

## 2015-02-25 LAB — URINE CULTURE

## 2016-02-18 IMAGING — CT CT ANGIO CHEST
2 of 6 series · 18 of 36 positions shown · IV contrast (APPLIED)
Comparison: Radiographs earlier this day.

CLINICAL DATA: Dyspnea. Weakness, shortness of breath, myalgias,
nausea and chills for 1 week. History of lupus and fibrosis.

EXAM:
CT ANGIOGRAPHY CHEST WITH CONTRAST
TECHNIQUE: Multidetector CT imaging of the chest was performed using the
standard protocol during bolus administration of intravenous
contrast. Multiplanar CT image reconstructions and MIPs were
obtained to evaluate the vascular anatomy.
CONTRAST:  100mL OMNIPAQUE IOHEXOL 350 MG/ML SOLN

[Series 6: pe 1.0 b26f · axial · 0.71mm/px · z∈[-351,-82]mm · 17 of 299 slices shown]
[im 15/299  lung]
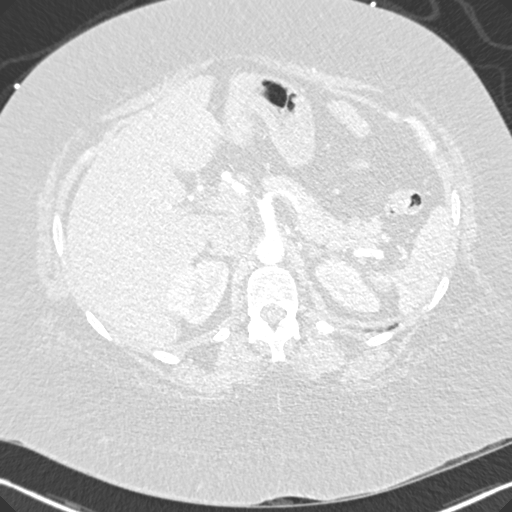
[im 30/299  mediastinal]
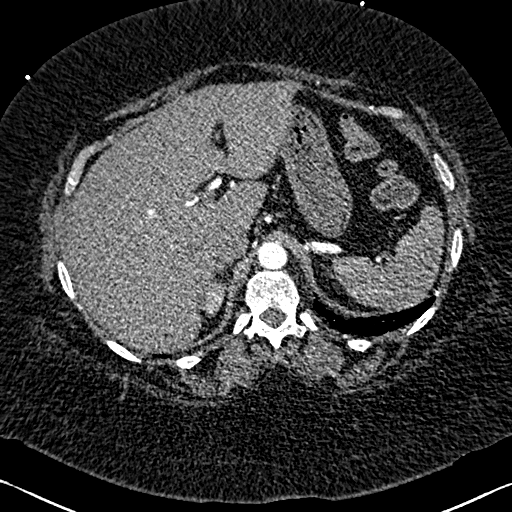
[im 45/299  lung]
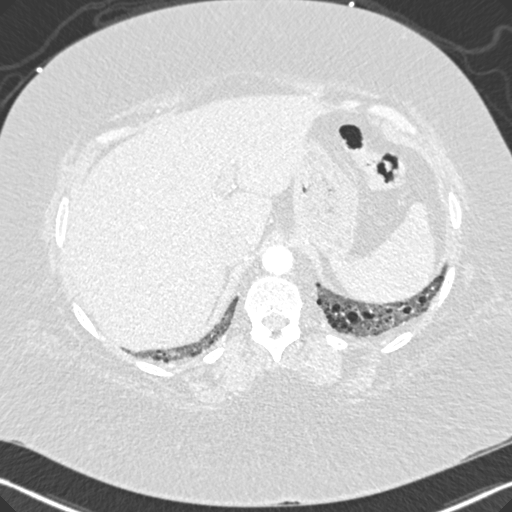
[im 60/299  mediastinal]
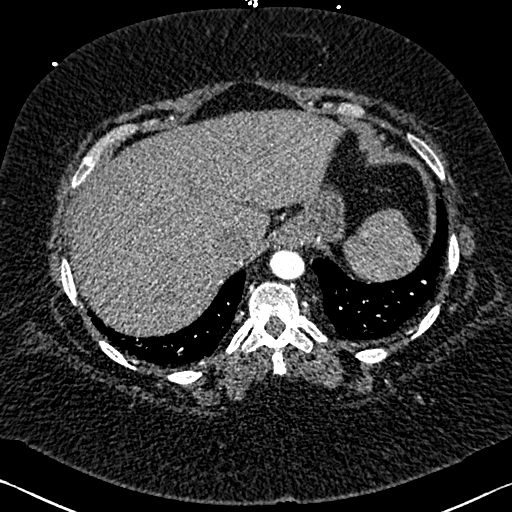
[im 90/299  lung]
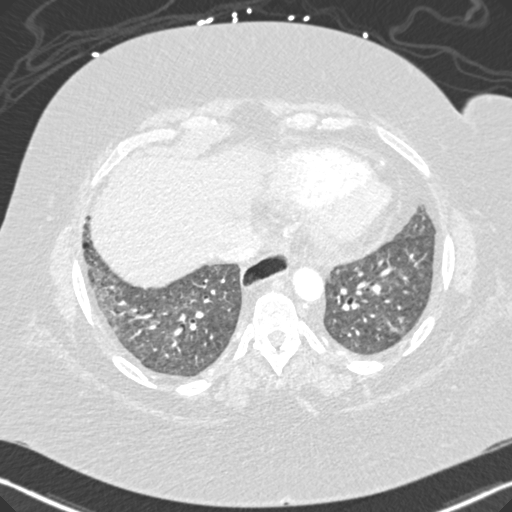
[im 105/299  mediastinal]
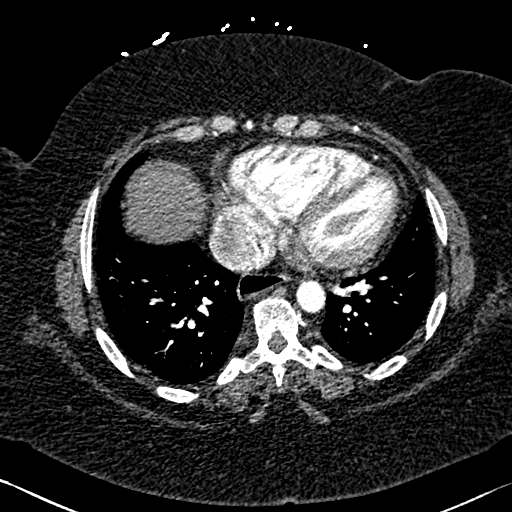
[im 120/299  lung]
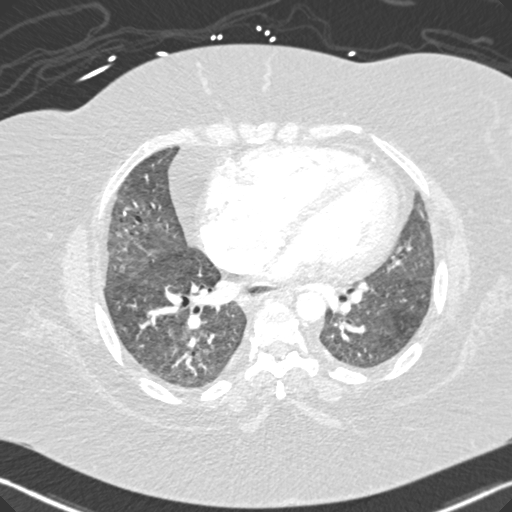
[im 135/299  mediastinal]
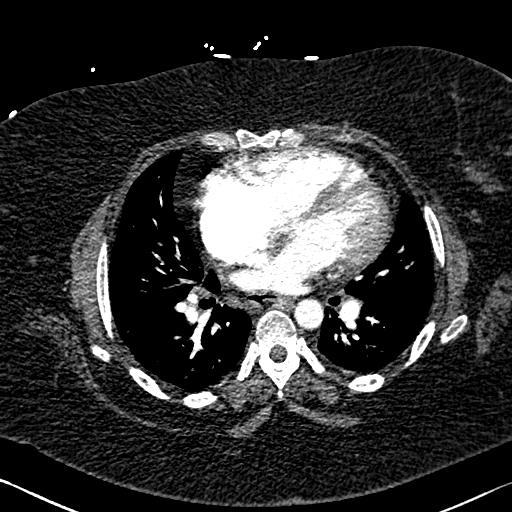
[im 150/299  lung]
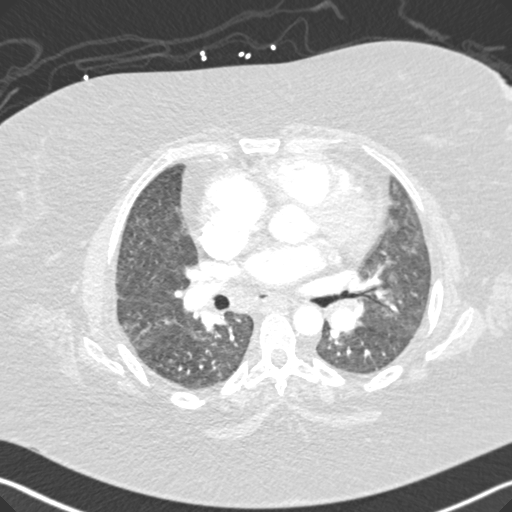
[im 164/299  mediastinal]
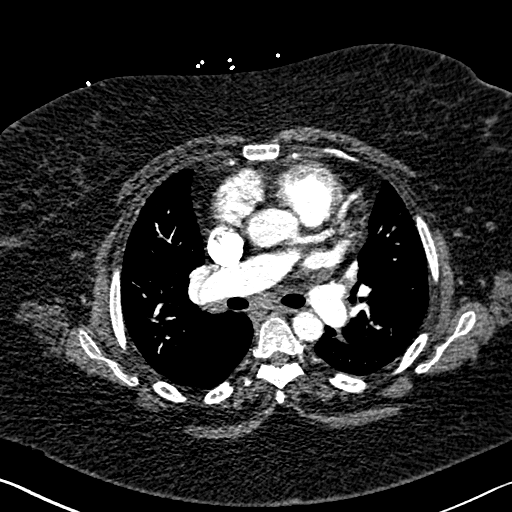
[im 179/299  lung]
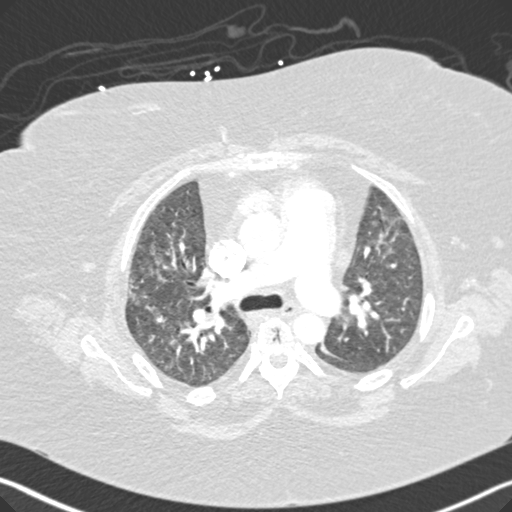
[im 194/299  mediastinal]
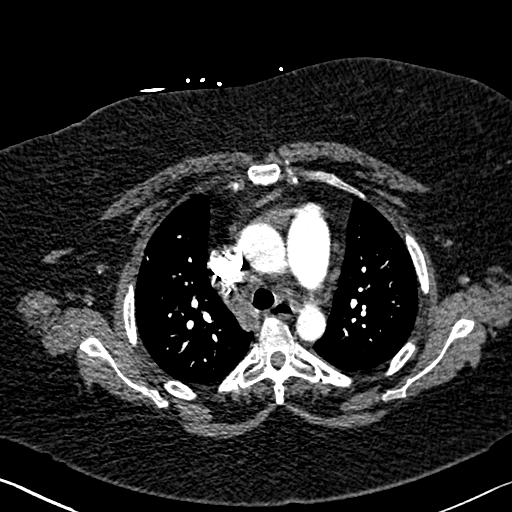
[im 209/299  lung]
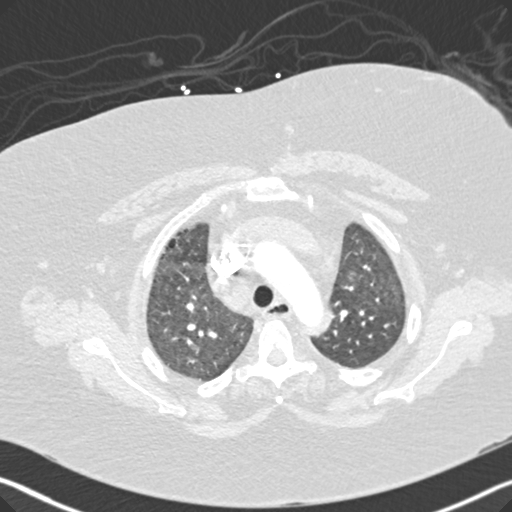
[im 239/299  mediastinal]
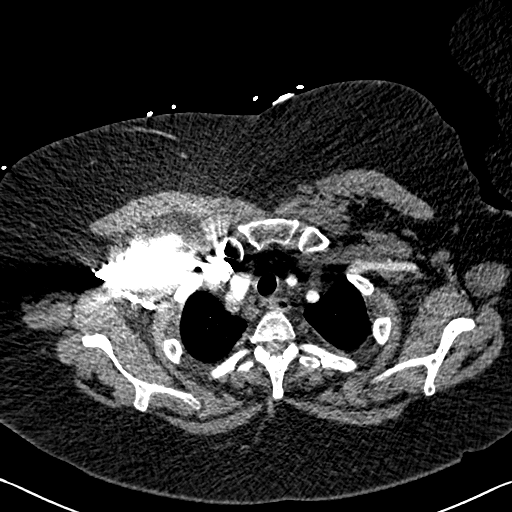
[im 254/299  lung]
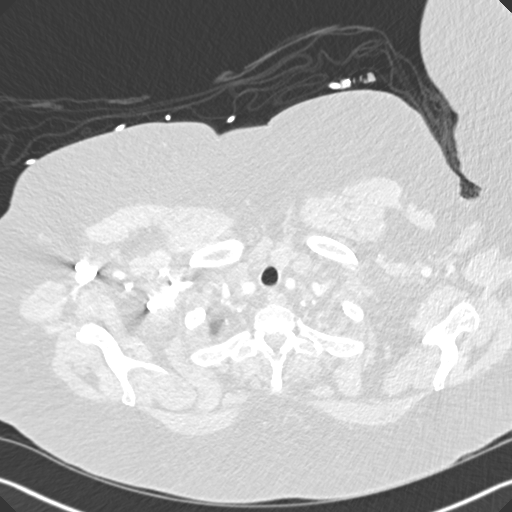
[im 269/299  mediastinal]
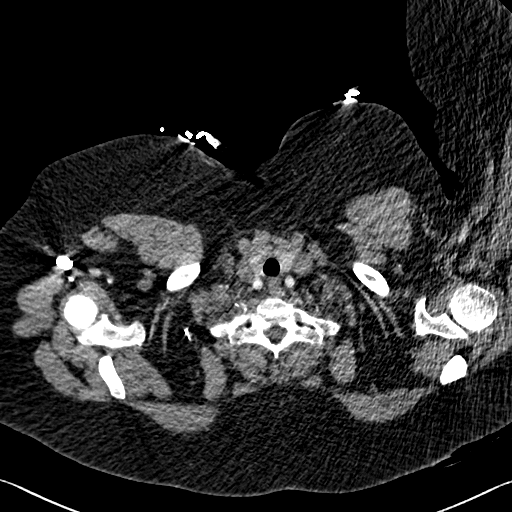
[im 284/299  lung]
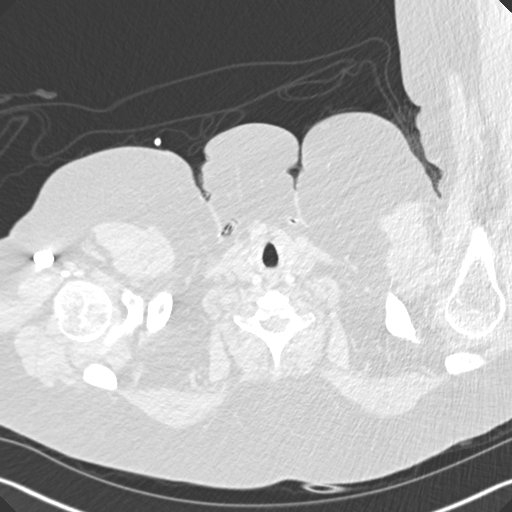

[Series 9: pe 2.0 coronal · coronal · 0.59mm/px · 1 of 129 slices shown]
[im 65/129  mediastinal]
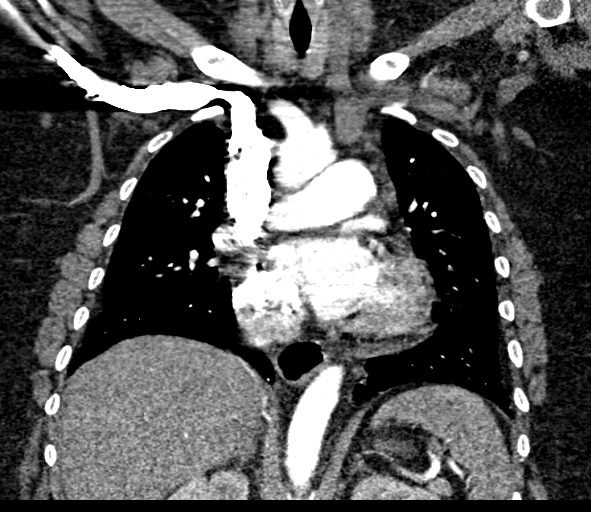

[18 of 36 positions shown; findings below may reference images not displayed]

FINDINGS: There are no filling defects within the pulmonary arteries to
suggest pulmonary embolus.

The thoracic aorta is normal in caliber without evidence of
dissection. There is moderate multi chamber cardiomegaly physiologic
pericardial fluid without effusion. No pleural effusion.

There is mild mediastinal adenopathy for example lower paratracheal
lymph node short axis dimension of 1.7 cm. Multiple enlarged upper
paratracheal lymph nodes. No hilar adenopathy.

Subpleural reticulation with bibasilar bronchiectasis, with mild
bronchiectasis in right middle lobe. Subpleural fibrosis in the
upper lobes. No septal thickening for findings to suggest pulmonary
edema. Generally diffusely increased attenuation throughout the lung
parenchyma. No confluent airspace disease. Scattered granulomas in
the right lower lobe.

Patulous esophagus.  No esophageal wall thickening.

No acute abnormality in the included upper abdomen. Liver appears
prominent in size.

There are no acute or suspicious osseous abnormalities.

Review of the MIP images confirms the above findings.
IMPRESSION: 1. No pulmonary embolus.
2. Peripheral fibrosis with bronchiectasis in the lower lobes and
right middle lobe consistent with interstitial lung disease.
Suggestion underlying diffuse ground-glass opacity throughout the
lung parenchyma, also likely related to interstitial lung disease.
3. Cardiomegaly.

## 2018-08-10 ENCOUNTER — Other Ambulatory Visit: Payer: Self-pay

## 2018-08-10 ENCOUNTER — Inpatient Hospital Stay (HOSPITAL_COMMUNITY)
Admission: EM | Admit: 2018-08-10 | Discharge: 2018-08-19 | DRG: 291 | Disposition: A | Discharge status: Home Health | Payer: Medicaid Other | Attending: Student | Admitting: Student

## 2018-08-10 ENCOUNTER — Emergency Department (HOSPITAL_COMMUNITY): Payer: Medicaid Other

## 2018-08-10 ENCOUNTER — Encounter (HOSPITAL_COMMUNITY): Payer: Self-pay

## 2018-08-10 DIAGNOSIS — D869 Sarcoidosis, unspecified: Secondary | ICD-10-CM | POA: Diagnosis present

## 2018-08-10 DIAGNOSIS — G4700 Insomnia, unspecified: Secondary | ICD-10-CM | POA: Diagnosis present

## 2018-08-10 DIAGNOSIS — E039 Hypothyroidism, unspecified: Secondary | ICD-10-CM | POA: Diagnosis not present

## 2018-08-10 DIAGNOSIS — Z79890 Hormone replacement therapy (postmenopausal): Secondary | ICD-10-CM

## 2018-08-10 DIAGNOSIS — I5033 Acute on chronic diastolic (congestive) heart failure: Secondary | ICD-10-CM | POA: Diagnosis present

## 2018-08-10 DIAGNOSIS — E785 Hyperlipidemia, unspecified: Secondary | ICD-10-CM | POA: Diagnosis present

## 2018-08-10 DIAGNOSIS — I509 Heart failure, unspecified: Secondary | ICD-10-CM

## 2018-08-10 DIAGNOSIS — J8410 Pulmonary fibrosis, unspecified: Secondary | ICD-10-CM | POA: Diagnosis present

## 2018-08-10 DIAGNOSIS — Z888 Allergy status to other drugs, medicaments and biological substances status: Secondary | ICD-10-CM

## 2018-08-10 DIAGNOSIS — N183 Chronic kidney disease, stage 3 unspecified: Secondary | ICD-10-CM | POA: Diagnosis present

## 2018-08-10 DIAGNOSIS — I5082 Biventricular heart failure: Secondary | ICD-10-CM | POA: Diagnosis present

## 2018-08-10 DIAGNOSIS — I2781 Cor pulmonale (chronic): Secondary | ICD-10-CM | POA: Diagnosis present

## 2018-08-10 DIAGNOSIS — J9621 Acute and chronic respiratory failure with hypoxia: Secondary | ICD-10-CM | POA: Diagnosis not present

## 2018-08-10 DIAGNOSIS — IMO0002 Reserved for concepts with insufficient information to code with codable children: Secondary | ICD-10-CM | POA: Diagnosis present

## 2018-08-10 DIAGNOSIS — G8929 Other chronic pain: Secondary | ICD-10-CM | POA: Diagnosis present

## 2018-08-10 DIAGNOSIS — M329 Systemic lupus erythematosus, unspecified: Secondary | ICD-10-CM | POA: Diagnosis not present

## 2018-08-10 DIAGNOSIS — I130 Hypertensive heart and chronic kidney disease with heart failure and stage 1 through stage 4 chronic kidney disease, or unspecified chronic kidney disease: Secondary | ICD-10-CM | POA: Diagnosis present

## 2018-08-10 DIAGNOSIS — Z8249 Family history of ischemic heart disease and other diseases of the circulatory system: Secondary | ICD-10-CM | POA: Diagnosis not present

## 2018-08-10 DIAGNOSIS — K219 Gastro-esophageal reflux disease without esophagitis: Secondary | ICD-10-CM | POA: Diagnosis present

## 2018-08-10 DIAGNOSIS — Z79899 Other long term (current) drug therapy: Secondary | ICD-10-CM

## 2018-08-10 DIAGNOSIS — I5031 Acute diastolic (congestive) heart failure: Secondary | ICD-10-CM | POA: Diagnosis not present

## 2018-08-10 DIAGNOSIS — Z7952 Long term (current) use of systemic steroids: Secondary | ICD-10-CM

## 2018-08-10 DIAGNOSIS — Z6839 Body mass index (BMI) 39.0-39.9, adult: Secondary | ICD-10-CM

## 2018-08-10 DIAGNOSIS — D631 Anemia in chronic kidney disease: Secondary | ICD-10-CM | POA: Diagnosis present

## 2018-08-10 DIAGNOSIS — M341 CR(E)ST syndrome: Secondary | ICD-10-CM | POA: Diagnosis present

## 2018-08-10 DIAGNOSIS — D573 Sickle-cell trait: Secondary | ICD-10-CM | POA: Diagnosis present

## 2018-08-10 DIAGNOSIS — I2729 Other secondary pulmonary hypertension: Secondary | ICD-10-CM | POA: Diagnosis present

## 2018-08-10 DIAGNOSIS — R0602 Shortness of breath: Secondary | ICD-10-CM | POA: Diagnosis present

## 2018-08-10 DIAGNOSIS — Z7951 Long term (current) use of inhaled steroids: Secondary | ICD-10-CM

## 2018-08-10 DIAGNOSIS — R0902 Hypoxemia: Secondary | ICD-10-CM

## 2018-08-10 HISTORY — DX: Chronic kidney disease, stage 3 unspecified: N18.30

## 2018-08-10 HISTORY — DX: Cr(e)st syndrome: M34.1

## 2018-08-10 HISTORY — DX: Cor pulmonale (chronic): I27.81

## 2018-08-10 HISTORY — DX: Chronic kidney disease, stage 3 (moderate): N18.3

## 2018-08-10 HISTORY — DX: Anemia, unspecified: D64.9

## 2018-08-10 HISTORY — DX: Chronic respiratory failure, unspecified whether with hypoxia or hypercapnia: J96.10

## 2018-08-10 LAB — BASIC METABOLIC PANEL
Anion gap: 14 (ref 5–15)
BUN: 58 mg/dL — ABNORMAL HIGH (ref 6–20)
CO2: 25 mmol/L (ref 22–32)
Calcium: 8.9 mg/dL (ref 8.9–10.3)
Chloride: 92 mmol/L — ABNORMAL LOW (ref 98–111)
Creatinine, Ser: 1.47 mg/dL — ABNORMAL HIGH (ref 0.44–1.00)
GFR calc Af Amer: 48 mL/min — ABNORMAL LOW (ref >60)
GFR calc non Af Amer: 42 mL/min — ABNORMAL LOW (ref >60)
GLUCOSE: 128 mg/dL — AB (ref 70–99)
POTASSIUM: 3.3 mmol/L — AB (ref 3.5–5.1)
Sodium: 131 mmol/L — ABNORMAL LOW (ref 135–145)

## 2018-08-10 LAB — POCT I-STAT TROPONIN I: Troponin i, poc: 0.04 ng/mL (ref 0.00–0.08)

## 2018-08-10 LAB — CBC
HCT: 28.7 % — ABNORMAL LOW (ref 36.0–46.0)
Hemoglobin: 8.6 g/dL — ABNORMAL LOW (ref 12.0–15.0)
MCH: 27.6 pg (ref 26.0–34.0)
MCHC: 30 g/dL (ref 30.0–36.0)
MCV: 92 fL (ref 80.0–100.0)
Platelets: 302 10*3/uL (ref 150–400)
RBC: 3.12 MIL/uL — ABNORMAL LOW (ref 3.87–5.11)
RDW: 15.6 % — ABNORMAL HIGH (ref 11.5–15.5)
WBC: 5.5 10*3/uL (ref 4.0–10.5)
nRBC: 0 % (ref 0.0–0.2)

## 2018-08-10 LAB — I-STAT BETA HCG BLOOD, ED (NOT ORDERABLE): I-stat hCG, quantitative: <5 m[IU]/mL (ref <5)

## 2018-08-10 MED ORDER — SODIUM CHLORIDE 0.9% FLUSH
3.0000 mL | Freq: Once | INTRAVENOUS | Status: AC
  Administered 2018-08-10: 3 mL via INTRAVENOUS

## 2018-08-10 MED ORDER — FUROSEMIDE 10 MG/ML IJ SOLN
80.0000 mg | Freq: Once | INTRAMUSCULAR | Status: AC
  Administered 2018-08-10: 80 mg via INTRAVENOUS
  Filled 2018-08-10: qty 8

## 2018-08-10 NOTE — ED Notes (Signed)
Attempted IVx2. Both attempts unsuccessful.

## 2018-08-10 NOTE — ED Triage Notes (Signed)
Pt reports shortness of breath and R sided chest pain that started 2-3 days ago. Pt on 3L at home and O2 sat 85% Pt appears lethargic. Hx of CHF, lupus, and fibrosis of lung. Endorses generalized weakness as well. A&Ox4.

## 2018-08-10 NOTE — ED Provider Notes (Signed)
[facility] DEPT Provider Note   CSN: [id number] Arrival date & time: 08/10/18  2015     History   Chief Complaint Chief Complaint  Patient presents with  . Shortness of Breath    HPI Christine Khan is a 48 y.o. female with history of lupus, pulmonary fibrosis, anemia of chronic disease, CKD stage III, noninfectious lymphedema, hypertension, GERD, major depressive disorder, chronic cor pulmonale, sarcoidosis presents for evaluation of acute onset, progressively worsening shortness of breath for 3 days.  She reports nonproductive cough, shortness of breath which worsens with exertion, and exertional chest pain which is right-sided and radiates into the right upper extremity.  Also notes worsening bilateral lower extremity edema. Denies fevers or chills. Endorses nausea but not vomiting. Has had an increased oxygen requirement and reports that she is approximately 10 pounds above her dry weight.  Has been taking her home medicines without relief.  The history is provided by the patient.    Past Medical History:  Diagnosis Date  . Fibrosis of lung ([place])   . Lupus [facility])     Patient Active Problem List   Diagnosis Date Noted  . Acute CHF (congestive heart failure) ([place]) 08/10/2018    History reviewed. No pertinent surgical history.   OB History   No obstetric history on file.      Home Medications    Prior to Admission medications   Medication Sig Start Date End Date Taking? Authorizing Provider  albuterol (PROVENTIL HFA;VENTOLIN HFA) 108 (90 BASE) MCG/ACT inhaler Inhale 2 puffs into the lungs every 6 (six) hours as needed. For shortness of breath   Yes [provider]  albuterol (PROVENTIL) (2.5 MG/3ML) 0.083% nebulizer solution Take 2.5 mg by nebulization every 6 (six) hours as needed. For shortness of breath   Yes [provider]  beclomethasone (QVAR) 80 MCG/ACT inhaler Inhale 1 puff into the lungs 2 (two) times daily. For  shortness of  breath    Yes [provider]  bumetanide (BUMEX) 2 MG tablet Take 2 mg by mouth 2 (two) times daily. 06/09/18  Yes [provider]  calcium carbonate (CALCIUM 500) 1250 MG tablet Take 1 tablet by mouth 2 (two) times daily.   Yes [provider]  cetirizine (ZYRTEC) 10 MG tablet Take 10 mg by mouth daily.   Yes [provider]  colchicine-probenecid 0.5-500 MG tablet Take 1 tablet by mouth daily. 08/03/18  Yes [provider]  cyanocobalamin (,VITAMIN B-12,) 1000 MCG/ML injection Inject 1,000 mcg into the muscle every 30 (thirty) days. 02/21/15  Yes [provider]  febuxostat (ULORIC) 40 MG tablet Take 40 mg by mouth daily. 12/27/17  Yes [provider]  gabapentin (NEURONTIN) 300 MG capsule Take 900 mg by mouth 2 (two) times daily. 07/25/18  Yes [provider]  hydroxychloroquine (PLAQUENIL) 200 MG tablet Take 400 mg by mouth 2 (two) times daily.    Yes [provider]  Iron-Folic Acid-Vit R44 (IRON FORMULA PO) Take 1 tablet by mouth daily.   Yes [provider]  levothyroxine (SYNTHROID, LEVOTHROID) 75 MCG tablet Take 37.5 mcg by mouth daily. 07/25/18  Yes [provider]  lidocaine (XYLOCAINE) 5 % ointment Apply 2 g topically 4 (four) times daily as needed for pain. 07/25/18  Yes [provider]  metolazone (ZAROXOLYN) 5 MG tablet Take 5 mg by mouth every other day. If needed for swelling 06/09/18  Yes [provider]  montelukast (SINGULAIR) 10 MG tablet Take 10 mg  by mouth daily.   Yes [provider]  mycophenolate (CELLCEPT) 200 MG/ML suspension Take 7.5 mLs by mouth 2 (two) times daily. 07/25/18  Yes [provider]  NUCYNTA 100 MG TABS Take 100 mg by mouth every 6 (six) hours as needed for pain. 07/27/18  Yes [provider]  potassium chloride SA (K-DUR,KLOR-CON) 20 MEQ tablet Take 20 mEq by mouth daily.   Yes [provider]    predniSONE (DELTASONE) 5 MG tablet Take 7.5 mg by mouth daily. Continuous 07/25/18  Yes [provider]  ramelteon (ROZEREM) 8 MG tablet Take 8 mg by mouth at bedtime as needed for sleep. 06/28/18  Yes [provider]  sildenafil (REVATIO) 20 MG tablet Take 20 mg by mouth 3 (three) times daily. 05/27/15  Yes [provider]  simvastatin (ZOCOR) 20 MG tablet Take 20 mg by mouth daily. 07/25/18  Yes [provider]  sulfamethoxazole-trimethoprim (BACTRIM DS,SEPTRA DS) 800-160 MG tablet Take 1 tablet by mouth as directed. Three times a week 04/11/17  Yes [provider]  tacrolimus (PROTOPIC) 0.1 % ointment Apply 1 application topically 2 (two) times daily.   Yes [provider]  tiZANidine (ZANAFLEX) 4 MG tablet Take 4-12 mg by mouth at bedtime as needed for muscle spasms. 07/25/18  Yes [provider]  traZODone (DESYREL) 50 MG tablet Take 50 mg by mouth at bedtime as needed for sleep. 06/29/16  Yes [provider]  cephALEXin (KEFLEX) 500 MG capsule Take 1 capsule (500 mg total) by mouth 2 (two) times daily. Patient not taking: Reported on 08/10/2018 02/24/15   Elnora Morrison, MD    Family History History reviewed. No pertinent family history.  Social History Social History   Tobacco Use  . Smoking status: Never Smoker  Substance Use Topics  . Alcohol use: No  . Drug use: Not on file     Allergies   Nitroglycerin; Zolpidem; Enoxaparin; and Heparin   Review of Systems Review of Systems  Respiratory: Positive for cough and shortness of breath.   Cardiovascular: Positive for chest pain and leg swelling.  Gastrointestinal: Positive for nausea. Negative for vomiting.  All other systems reviewed and are negative.    Physical Exam Updated Vital Signs BP 110/69   Pulse 87   Temp 97.8 F (36.6 C) (Oral)   Resp 18   SpO2 98%   Physical Exam Vitals signs and nursing note reviewed.  Constitutional:      General:  She is not in acute distress.    Appearance: She is well-developed. She is obese. She is ill-appearing.  HENT:     Head: Normocephalic and atraumatic.  Eyes:     General:        Right eye: No discharge.        Left eye: No discharge.     Conjunctiva/sclera: Conjunctivae normal.  Neck:     Vascular: No JVD.     Trachea: No tracheal deviation.  Cardiovascular:     Rate and Rhythm: Normal rate.     Pulses: Normal pulses.     Comments: 4+ pitting edema of the bilateral lower extremity Pulmonary:     Comments: Examination limited due to body habitus.  Diffuse crackles.  SPO2 saturations 96% on 6 L nasal cannula Abdominal:     General: Bowel sounds are normal. There is no distension.     Palpations: Abdomen is soft.     Tenderness: There is no abdominal tenderness. There is no guarding.  Skin:  Findings: No erythema.  Neurological:     Mental Status: She is alert.  Psychiatric:        Behavior: Behavior normal.      ED Treatments / Results  Labs (all labs ordered are listed, but only abnormal results are displayed) Labs Reviewed  BASIC METABOLIC PANEL - Abnormal; Notable for the following components:      Result Value   Sodium 131 (*)    Potassium 3.3 (*)    Chloride 92 (*)    Glucose, Bld 128 (*)    BUN 58 (*)    Creatinine, Ser 1.47 (*)    GFR calc non Af Amer 42 (*)    GFR calc Af Amer 48 (*)    All other components within normal limits  CBC - Abnormal; Notable for the following components:   RBC 3.12 (*)    Hemoglobin 8.6 (*)    HCT 28.7 (*)    RDW 15.6 (*)    All other components within normal limits  BRAIN NATRIURETIC PEPTIDE  HEPATIC FUNCTION PANEL  TSH  I-STAT TROPONIN, ED  I-STAT BETA HCG BLOOD, ED (MC, [facility], AP ONLY)  POCT I-STAT TROPONIN I  I-STAT BETA HCG BLOOD, ED (NOT ORDERABLE)    EKG EKG Interpretation  Date/Time:  Thursday August 10 2018 20:31:41 EST Ventricular Rate:  100 PR Interval:    QRS Duration: 105 QT Interval:  357 QTC  Calculation: 461 R Axis:   85 Text Interpretation:  Sinus tachycardia Probable left atrial enlargement Abnormal R-wave progression, early transition ST-t wave abnormality Artifact Abnormal ekg Confirmed by Carmin Muskrat [phone]) on 08/10/2018 10:29:55 PM   Radiology Dg Chest 2 View  Result Date: 08/10/2018 CLINICAL DATA:  Chest pain, shortness of breath EXAM: CHEST - 2 VIEW COMPARISON:  CTA chest dated 02/23/2015 FINDINGS: Increased interstitial markings, chronic, likely reflecting chronic interstitial lung disease when correlating with prior CT. No focal consolidation. No pleural effusion or pneumothorax. Cardiomegaly. Degenerative changes of the visualized thoracolumbar spine. IMPRESSION: Chronic interstitial lung disease. Cardiomegaly. Electronically Signed   By: Julian Hy M.D.   On: 08/10/2018 21:19    Procedures Procedures (including critical care time)  Medications Ordered in ED Medications  sodium chloride flush (NS) 0.9 % injection 3 mL (3 mLs Intravenous Given 08/10/18 2354)  furosemide (LASIX) injection 80 mg (80 mg Intravenous Given 08/10/18 2351)     Initial Impression / Assessment and Plan / ED Course  I have reviewed the triage vital signs and the nursing notes.  Pertinent labs & imaging results that were available during my care of the patient were reviewed by me and considered in my medical decision making (see chart for details).     Patient with complex medical history presenting for evaluation of worsening shortness of breath, lower extremity edema, weight gain, and chest pains.  She is afebrile, hypoxic at her usual 3 L via nasal cannula with improvement on 5 to 6 L via nasal cannula.  Examination somewhat difficult due to body habitus and pulmonary fibrosis.  She does appear volume overloaded with worsening lower extremity edema bilaterally.  BUN and creatinine elevated and she is anemic with a hemoglobin of 8.6.  Most likely chronic due to her medical  conditions.  EKG shows nonspecific ST-T wave abnormalities but initial troponin is negative.  Low suspicion of ACS/MI.  Presentation more consistent with CHF exacerbation.  Chest x-ray shows cardiomegaly and chronic interstitial lung disease.  No evidence of pneumonia.  Will give IV Lasix.  Spoke with Dr. Hal Hope with tried hospitalist service who agrees to assume care of patient and bring her into the hospital for further evaluation and management.  Patient seen and evaluated by Dr. Ronnald Nian who agrees with assessment and plan at this time  Final Clinical Impressions(s) / ED Diagnoses   Final diagnoses:  Acute on chronic congestive heart failure, unspecified heart failure type [facility])  Hypoxia    ED Discharge Orders    None       Renita Papa, PA-C 08/11/18 0002    Lennice Sites, DO 08/11/18 0022

## 2018-08-10 NOTE — ED Notes (Signed)
IV team at bedside 

## 2018-08-11 ENCOUNTER — Encounter (HOSPITAL_COMMUNITY): Payer: Self-pay | Admitting: Internal Medicine

## 2018-08-11 DIAGNOSIS — M329 Systemic lupus erythematosus, unspecified: Secondary | ICD-10-CM | POA: Diagnosis present

## 2018-08-11 DIAGNOSIS — J9621 Acute and chronic respiratory failure with hypoxia: Secondary | ICD-10-CM | POA: Diagnosis present

## 2018-08-11 DIAGNOSIS — I2781 Cor pulmonale (chronic): Secondary | ICD-10-CM | POA: Diagnosis present

## 2018-08-11 DIAGNOSIS — E039 Hypothyroidism, unspecified: Secondary | ICD-10-CM | POA: Diagnosis present

## 2018-08-11 DIAGNOSIS — N183 Chronic kidney disease, stage 3 unspecified: Secondary | ICD-10-CM | POA: Diagnosis present

## 2018-08-11 DIAGNOSIS — IMO0002 Reserved for concepts with insufficient information to code with codable children: Secondary | ICD-10-CM | POA: Diagnosis present

## 2018-08-11 LAB — TSH
TSH: 2.262 u[IU]/mL (ref 0.350–4.500)
TSH: 2.316 u[IU]/mL (ref 0.350–4.500)

## 2018-08-11 LAB — TROPONIN I
Troponin I: 0.06 ng/mL (ref <0.03)
Troponin I: 0.05 ng/mL (ref <0.03)
Troponin I: 0.05 ng/mL (ref <0.03)

## 2018-08-11 LAB — HEPATIC FUNCTION PANEL
ALT: 8 U/L (ref 0–44)
AST: 20 U/L (ref 15–41)
Albumin: 4.7 g/dL (ref 3.5–5.0)
Alkaline Phosphatase: 56 U/L (ref 38–126)
Bilirubin, Direct: 0.1 mg/dL (ref 0.0–0.2)
Indirect Bilirubin: 0.6 mg/dL (ref 0.3–0.9)
Total Bilirubin: 0.7 mg/dL (ref 0.3–1.2)
Total Protein: 7.5 g/dL (ref 6.5–8.1)

## 2018-08-11 LAB — BASIC METABOLIC PANEL
Anion gap: 14 (ref 5–15)
Anion gap: 10 (ref 5–15)
BUN: 55 mg/dL — ABNORMAL HIGH (ref 6–20)
BUN: 57 mg/dL — ABNORMAL HIGH (ref 6–20)
CHLORIDE: 94 mmol/L — AB (ref 98–111)
CO2: 26 mmol/L (ref 22–32)
CO2: 28 mmol/L (ref 22–32)
CREATININE: 1.45 mg/dL — AB (ref 0.44–1.00)
Calcium: 8.9 mg/dL (ref 8.9–10.3)
Calcium: 8.5 mg/dL — ABNORMAL LOW (ref 8.9–10.3)
Chloride: 93 mmol/L — ABNORMAL LOW (ref 98–111)
Creatinine, Ser: 1.43 mg/dL — ABNORMAL HIGH (ref 0.44–1.00)
GFR calc Af Amer: 50 mL/min — ABNORMAL LOW (ref >60)
GFR calc Af Amer: 49 mL/min — ABNORMAL LOW (ref >60)
GFR calc non Af Amer: 43 mL/min — ABNORMAL LOW (ref >60)
GFR calc non Af Amer: 42 mL/min — ABNORMAL LOW (ref >60)
GLUCOSE: 116 mg/dL — AB (ref 70–99)
Glucose, Bld: 146 mg/dL — ABNORMAL HIGH (ref 70–99)
Potassium: 2.8 mmol/L — ABNORMAL LOW (ref 3.5–5.1)
Potassium: 3.8 mmol/L (ref 3.5–5.1)
SODIUM: 132 mmol/L — AB (ref 135–145)
Sodium: 133 mmol/L — ABNORMAL LOW (ref 135–145)

## 2018-08-11 LAB — CBC
HCT: 28.4 % — ABNORMAL LOW (ref 36.0–46.0)
Hemoglobin: 8.4 g/dL — ABNORMAL LOW (ref 12.0–15.0)
MCH: 27.1 pg (ref 26.0–34.0)
MCHC: 29.6 g/dL — ABNORMAL LOW (ref 30.0–36.0)
MCV: 91.6 fL (ref 80.0–100.0)
PLATELETS: 268 10*3/uL (ref 150–400)
RBC: 3.1 MIL/uL — ABNORMAL LOW (ref 3.87–5.11)
RDW: 15.7 % — ABNORMAL HIGH (ref 11.5–15.5)
WBC: 5.7 10*3/uL (ref 4.0–10.5)
nRBC: 0 % (ref 0.0–0.2)

## 2018-08-11 LAB — BRAIN NATRIURETIC PEPTIDE: B Natriuretic Peptide: 1001 pg/mL — ABNORMAL HIGH (ref 0.0–100.0)

## 2018-08-11 LAB — HIV ANTIBODY (ROUTINE TESTING W REFLEX): HIV Screen 4th Generation wRfx: NONREACTIVE

## 2018-08-11 LAB — D-DIMER, QUANTITATIVE: D-Dimer, Quant: 0.8 ug/mL-FEU — ABNORMAL HIGH (ref 0.00–0.50)

## 2018-08-11 LAB — MAGNESIUM: Magnesium: 2.1 mg/dL (ref 1.7–2.4)

## 2018-08-11 MED ORDER — ACETAMINOPHEN 650 MG RE SUPP: 650.0000 mg | Freq: Four times a day (QID) | RECTAL | Status: DC | PRN

## 2018-08-11 MED ORDER — HYDROXYCHLOROQUINE SULFATE 200 MG PO TABS
400.0000 mg | ORAL_TABLET | Freq: Two times a day (BID) | ORAL | Status: DC
  Administered 2018-08-11 – 2018-08-12 (×3): 400 mg via ORAL
  Filled 2018-08-11 (×4): qty 2

## 2018-08-11 MED ORDER — TACROLIMUS 0.1 % EX OINT: 1.0000 "application " | TOPICAL_OINTMENT | Freq: Two times a day (BID) | CUTANEOUS | Status: DC

## 2018-08-11 MED ORDER — FEBUXOSTAT 40 MG PO TABS
40.0000 mg | ORAL_TABLET | Freq: Every day | ORAL | Status: DC
  Administered 2018-08-11 – 2018-08-19 (×9): 40 mg via ORAL
  Filled 2018-08-11 (×9): qty 1

## 2018-08-11 MED ORDER — POTASSIUM CHLORIDE CRYS ER 20 MEQ PO TBCR
20.0000 meq | EXTENDED_RELEASE_TABLET | Freq: Every day | ORAL | Status: DC
  Administered 2018-08-11 – 2018-08-12 (×2): 20 meq via ORAL
  Filled 2018-08-11 (×2): qty 1

## 2018-08-11 MED ORDER — POLYSACCHARIDE IRON COMPLEX 150 MG PO CAPS
150.0000 mg | ORAL_CAPSULE | Freq: Every day | ORAL | Status: DC
  Administered 2018-08-11 – 2018-08-19 (×9): 150 mg via ORAL
  Filled 2018-08-11 (×9): qty 1

## 2018-08-11 MED ORDER — BUDESONIDE 0.25 MG/2ML IN SUSP
0.2500 mg | Freq: Two times a day (BID) | RESPIRATORY_TRACT | Status: DC
  Administered 2018-08-11 – 2018-08-19 (×15): 0.25 mg via RESPIRATORY_TRACT
  Filled 2018-08-11 (×17): qty 2

## 2018-08-11 MED ORDER — TAPENTADOL HCL 50 MG PO TABS
100.0000 mg | ORAL_TABLET | Freq: Four times a day (QID) | ORAL | Status: DC | PRN
  Administered 2018-08-11 – 2018-08-19 (×28): 100 mg via ORAL
  Filled 2018-08-11 (×28): qty 2

## 2018-08-11 MED ORDER — TIZANIDINE HCL 4 MG PO TABS: 4.0000 mg | ORAL_TABLET | Freq: Every evening | ORAL | Status: DC | PRN

## 2018-08-11 MED ORDER — CALCIUM CARBONATE 1250 (500 CA) MG PO TABS
1.0000 | ORAL_TABLET | Freq: Two times a day (BID) | ORAL | Status: DC
  Administered 2018-08-11 – 2018-08-17 (×8): 500 mg via ORAL
  Filled 2018-08-11 (×16): qty 1

## 2018-08-11 MED ORDER — COLCHICINE-PROBENECID 0.5-500 MG PO TABS: 1.0000 | ORAL_TABLET | Freq: Every day | ORAL | Status: DC

## 2018-08-11 MED ORDER — ONDANSETRON HCL 4 MG/2ML IJ SOLN
4.0000 mg | Freq: Four times a day (QID) | INTRAMUSCULAR | Status: DC | PRN
  Administered 2018-08-11 – 2018-08-14 (×2): 4 mg via INTRAVENOUS
  Filled 2018-08-11 (×2): qty 2

## 2018-08-11 MED ORDER — PROBENECID 500 MG PO TABS
500.0000 mg | ORAL_TABLET | Freq: Every day | ORAL | Status: DC
  Administered 2018-08-11 – 2018-08-19 (×9): 500 mg via ORAL
  Filled 2018-08-11 (×9): qty 1

## 2018-08-11 MED ORDER — RAMELTEON 8 MG PO TABS
8.0000 mg | ORAL_TABLET | Freq: Every evening | ORAL | Status: DC | PRN
  Filled 2018-08-11: qty 1

## 2018-08-11 MED ORDER — ACETAMINOPHEN 325 MG PO TABS
650.0000 mg | ORAL_TABLET | Freq: Four times a day (QID) | ORAL | Status: DC | PRN
  Administered 2018-08-13 – 2018-08-16 (×2): 650 mg via ORAL
  Filled 2018-08-11 (×2): qty 2

## 2018-08-11 MED ORDER — GABAPENTIN 300 MG PO CAPS
900.0000 mg | ORAL_CAPSULE | Freq: Two times a day (BID) | ORAL | Status: DC
  Administered 2018-08-11 – 2018-08-19 (×18): 900 mg via ORAL
  Filled 2018-08-11 (×18): qty 3

## 2018-08-11 MED ORDER — TREPROSTINIL 0.6 MG/ML IN SOLN
18.0000 ug | Freq: Four times a day (QID) | RESPIRATORY_TRACT | Status: DC
  Administered 2018-08-11 – 2018-08-19 (×23): 18 ug via RESPIRATORY_TRACT
  Filled 2018-08-11: qty 2.9

## 2018-08-11 MED ORDER — COLCHICINE 0.6 MG PO TABS
0.6000 mg | ORAL_TABLET | Freq: Every day | ORAL | Status: DC
  Administered 2018-08-11 – 2018-08-19 (×9): 0.6 mg via ORAL
  Filled 2018-08-11 (×9): qty 1

## 2018-08-11 MED ORDER — LEVOTHYROXINE SODIUM 25 MCG PO TABS
37.5000 ug | ORAL_TABLET | Freq: Every day | ORAL | Status: DC
  Administered 2018-08-11 – 2018-08-19 (×9): 37.5 ug via ORAL
  Filled 2018-08-11 (×8): qty 2
  Filled 2018-08-11: qty 0.5

## 2018-08-11 MED ORDER — PREDNISONE 5 MG PO TABS
7.5000 mg | ORAL_TABLET | Freq: Every day | ORAL | Status: DC
  Administered 2018-08-11 – 2018-08-19 (×9): 7.5 mg via ORAL
  Filled 2018-08-11: qty 2
  Filled 2018-08-11: qty 1.5
  Filled 2018-08-11 (×7): qty 2

## 2018-08-11 MED ORDER — METOLAZONE 5 MG PO TABS
5.0000 mg | ORAL_TABLET | ORAL | Status: DC
  Administered 2018-08-11 – 2018-08-19 (×5): 5 mg via ORAL
  Filled 2018-08-11 (×5): qty 1

## 2018-08-11 MED ORDER — LORATADINE 10 MG PO TABS
10.0000 mg | ORAL_TABLET | Freq: Every day | ORAL | Status: DC
  Administered 2018-08-11 – 2018-08-19 (×9): 10 mg via ORAL
  Filled 2018-08-11 (×9): qty 1

## 2018-08-11 MED ORDER — MONTELUKAST SODIUM 10 MG PO TABS
10.0000 mg | ORAL_TABLET | Freq: Every day | ORAL | Status: DC
  Administered 2018-08-11 – 2018-08-19 (×9): 10 mg via ORAL
  Filled 2018-08-11 (×9): qty 1

## 2018-08-11 MED ORDER — MYCOPHENOLATE MOFETIL 250 MG PO CAPS
1500.0000 mg | ORAL_CAPSULE | Freq: Two times a day (BID) | ORAL | Status: DC
  Filled 2018-08-11 (×3): qty 6

## 2018-08-11 MED ORDER — ALBUTEROL SULFATE (2.5 MG/3ML) 0.083% IN NEBU: 2.5000 mg | INHALATION_SOLUTION | Freq: Four times a day (QID) | RESPIRATORY_TRACT | Status: DC | PRN

## 2018-08-11 MED ORDER — TRAZODONE HCL 50 MG PO TABS: 50.0000 mg | ORAL_TABLET | Freq: Every evening | ORAL | Status: DC | PRN

## 2018-08-11 MED ORDER — ONDANSETRON HCL 4 MG PO TABS: 4.0000 mg | ORAL_TABLET | Freq: Four times a day (QID) | ORAL | Status: DC | PRN

## 2018-08-11 MED ORDER — SIMVASTATIN 20 MG PO TABS
20.0000 mg | ORAL_TABLET | Freq: Every day | ORAL | Status: DC
  Administered 2018-08-11 – 2018-08-19 (×9): 20 mg via ORAL
  Filled 2018-08-11 (×9): qty 1

## 2018-08-11 MED ORDER — POTASSIUM CHLORIDE CRYS ER 20 MEQ PO TBCR
40.0000 meq | EXTENDED_RELEASE_TABLET | Freq: Once | ORAL | Status: AC
  Administered 2018-08-11: 40 meq via ORAL
  Filled 2018-08-11: qty 2

## 2018-08-11 MED ORDER — BECLOMETHASONE DIPROPIONATE 80 MCG/ACT IN AERS: 1.0000 | INHALATION_SPRAY | Freq: Two times a day (BID) | RESPIRATORY_TRACT | Status: DC

## 2018-08-11 MED ORDER — FUROSEMIDE 10 MG/ML IJ SOLN
80.0000 mg | Freq: Two times a day (BID) | INTRAMUSCULAR | Status: DC
  Administered 2018-08-11 – 2018-08-13 (×5): 80 mg via INTRAVENOUS
  Filled 2018-08-11 (×5): qty 8

## 2018-08-11 MED ORDER — MYCOPHENOLATE MOFETIL 200 MG/ML PO SUSR
1500.0000 mg | Freq: Two times a day (BID) | ORAL | Status: DC
  Administered 2018-08-11 – 2018-08-19 (×16): 1500 mg via ORAL
  Filled 2018-08-11 (×2): qty 7.5

## 2018-08-11 MED ORDER — SILDENAFIL CITRATE 20 MG PO TABS
20.0000 mg | ORAL_TABLET | Freq: Three times a day (TID) | ORAL | Status: DC
  Administered 2018-08-11 – 2018-08-19 (×25): 20 mg via ORAL
  Filled 2018-08-11 (×27): qty 1

## 2018-08-11 MED ORDER — LIDOCAINE 5 % EX OINT
2.0000 "application " | TOPICAL_OINTMENT | Freq: Four times a day (QID) | CUTANEOUS | Status: DC | PRN
  Filled 2018-08-11: qty 35.44

## 2018-08-11 MED ORDER — POTASSIUM CHLORIDE CRYS ER 20 MEQ PO TBCR
30.0000 meq | EXTENDED_RELEASE_TABLET | Freq: Once | ORAL | Status: AC
  Administered 2018-08-11: 30 meq via ORAL
  Filled 2018-08-11: qty 1

## 2018-08-11 NOTE — ED Notes (Signed)
ED TO INPATIENT HANDOFF REPORT  Name/Age/Gender Christine Khan 49 y.o. female  Code Status    Code Status Orders  (From admission, onward)         Start     Ordered   08/11/18 0228  Full code  Continuous     08/11/18 0229        Code Status History    This patient has a current code status but no historical code status.      Home/SNF/Other Home  Chief Complaint Chest pain, SOB, Weakness  Level of Care/Admitting Diagnosis ED Disposition    ED Disposition Condition Comment   Admit  Hospital Area: [facility] [[id number]]  Level of Care: Telemetry [5]  Admit to tele based on following criteria: Acute CHF  Diagnosis: Acute CHF (congestive heart failure) [facility]) [[id number]]  Admitting Physician: Rise Patience [phone]  Attending Physician: Rise Patience [phone]  Estimated length of stay: past midnight tomorrow  Certification:: I certify this patient will need inpatient services for at least 2 midnights  PT Class (Do Not Modify): Inpatient [101]  PT Acc Code (Do Not Modify): Private [1]       Medical History Past Medical History:  Diagnosis Date  . Anemia   . Chronic respiratory failure ([place])   . CKD (chronic kidney disease) stage 3, GFR 30-59 ml/min (H[facility])   . Cor pulmonale (chronic) ([place])   . CREST syndrome ([place])   . Fibrosis of lung ([place])   . Lupus (H[facility])     Allergies Allergies  Allergen Reactions  . Nitroglycerin Other (See Comments) and Rash    Pt taking sildenafil Pt taking sildenafil   . Zolpidem     Other reaction(s): Dizziness (intolerance), Hallucination Other reaction(s): Dizziness (intolerance)   . Enoxaparin Itching  . Heparin Itching    IV Location/Drains/Wounds Patient Lines/Drains/Airways Status   Active Line/Drains/Airways    Name:   Placement date:   Placement time:   Site:   Days:   Peripheral IV 08/10/18 Right;Anterior Forearm   08/10/18    2350    Forearm   1   Urethral Catheter Kiristin Ryan, RN  Non-latex   08/11/18    0018    Non-latex   less than 1          Labs/Imaging Results for orders placed or performed during the hospital encounter of 08/10/18 (from the past 48 hour(s))  Basic metabolic panel     Status: Abnormal   Collection Time: 08/10/18  8:55 PM  Result Value Ref Range   Sodium 131 (L) 135 - 145 mmol/L   Potassium 3.3 (L) 3.5 - 5.1 mmol/L   Chloride 92 (L) 98 - 111 mmol/L   CO2 25 22 - 32 mmol/L   Glucose, Bld 128 (H) 70 - 99 mg/dL   BUN 58 (H) 6 - 20 mg/dL   Creatinine, Ser 1.47 (H) 0.44 - 1.00 mg/dL   Calcium 8.9 8.9 - 10.3 mg/dL   GFR calc non Af Amer 42 (L) >60 mL/min   GFR calc Af Amer 48 (L) >60 mL/min   Anion gap 14 5 - 15    Comment: Performed at [facility], [place] [address], [place], [place] [address]  CBC     Status: Abnormal   Collection Time: 08/10/18  8:55 PM  Result Value Ref Range   WBC 5.5 4.0 - 10.5 K/uL   RBC 3.12 (L) 3.87 - 5.11 MIL/uL   Hemoglobin 8.6 (L) 12.0 -  15.0 g/dL   HCT 28.7 (L) 36.0 - 46.0 %   MCV 92.0 80.0 - 100.0 fL   MCH 27.6 26.0 - 34.0 pg   MCHC 30.0 30.0 - 36.0 g/dL   RDW 15.6 (H) 11.5 - 15.5 %   Platelets 302 150 - 400 K/uL   nRBC 0.0 0.0 - 0.2 %    Comment: Performed at [facility], [place] [address] [place], [place] [address]  POCT i-Stat troponin I     Status: None   Collection Time: 08/10/18  9:01 PM  Result Value Ref Range   Troponin i, poc 0.04 0.00 - 0.08 ng/mL   Comment 3            Comment: Due to the release kinetics of cTnI, a negative result within the first hours of the onset of symptoms does not rule out myocardial infarction with certainty. If myocardial infarction is still suspected, repeat the test at appropriate intervals.   I-Stat beta hCG blood, ED     Status: None   Collection Time: 08/10/18  9:02 PM  Result Value Ref Range   I-stat hCG, quantitative <5.0 <5 mIU/mL   Comment 3            Comment:   GEST. AGE      CONC.  (mIU/mL)   <=1 WEEK        5  - 50     2 WEEKS       50 - 500     3 WEEKS       100 - 10,000     4 WEEKS     1,000 - 30,000        FEMALE AND NON-PREGNANT FEMALE:     LESS THAN 5 mIU/mL   Brain natriuretic peptide     Status: Abnormal   Collection Time: 08/10/18 11:54 PM  Result Value Ref Range   B Natriuretic Peptide 1,001.0 (H) 0.0 - 100.0 pg/mL    Comment: Performed at [facility], [place] [address] [place], [place] [address]  Hepatic function panel     Status: None   Collection Time: 08/10/18 11:54 PM  Result Value Ref Range   Total Protein 7.5 6.5 - 8.1 g/dL   Albumin 4.7 3.5 - 5.0 g/dL   AST 20 15 - 41 U/L   ALT 8 0 - 44 U/L   Alkaline Phosphatase 56 38 - 126 U/L   Total Bilirubin 0.7 0.3 - 1.2 mg/dL   Bilirubin, Direct 0.1 0.0 - 0.2 mg/dL   Indirect Bilirubin 0.6 0.3 - 0.9 mg/dL    Comment: Performed at [facility], [place] [address] [place], [place] [address]  TSH     Status: None   Collection Time: 08/10/18 11:54 PM  Result Value Ref Range   TSH 2.316 0.350 - 4.500 uIU/mL    Comment: Performed by a 3rd Generation assay with a functional sensitivity of <=0.01 uIU/mL. Performed at [facility], [place] [address], [place], [place] [address]   HIV antibody (Routine Testing)     Status: None   Collection Time: 08/11/18  4:07 AM  Result Value Ref Range   HIV Screen 4th Generation wRfx Non Reactive Non Reactive    Comment: (NOTE) Performed At: [facility] [place], [place] [id number] Rush Farmer MD [id number]   Basic metabolic panel     Status: Abnormal   Collection Time: 08/11/18  4:07 AM  Result Value  Ref Range   Sodium 133 (L) 135 - 145 mmol/L   Potassium 2.8 (L) 3.5 - 5.1 mmol/L   Chloride 93 (L) 98 - 111 mmol/L   CO2 26 22 - 32 mmol/L   Glucose, Bld 116 (H) 70 - 99 mg/dL   BUN 55 (H) 6 - 20 mg/dL   Creatinine, Ser 1.43 (H) 0.44 - 1.00 mg/dL   Calcium 8.9 8.9 - 10.3 mg/dL   GFR calc non Af Amer 43 (L) >60  mL/min   GFR calc Af Amer 50 (L) >60 mL/min   Anion gap 14 5 - 15    Comment: Performed at [facility], [place] [address], [place], [place] [address]  CBC     Status: Abnormal   Collection Time: 08/11/18  4:07 AM  Result Value Ref Range   WBC 5.7 4.0 - 10.5 K/uL   RBC 3.10 (L) 3.87 - 5.11 MIL/uL   Hemoglobin 8.4 (L) 12.0 - 15.0 g/dL   HCT 28.4 (L) 36.0 - 46.0 %   MCV 91.6 80.0 - 100.0 fL   MCH 27.1 26.0 - 34.0 pg   MCHC 29.6 (L) 30.0 - 36.0 g/dL   RDW 15.7 (H) 11.5 - 15.5 %   Platelets 268 150 - 400 K/uL   nRBC 0.0 0.0 - 0.2 %    Comment: Performed at [facility], [place] [address] [place], [place] [address]  Magnesium     Status: None   Collection Time: 08/11/18  4:07 AM  Result Value Ref Range   Magnesium 2.1 1.7 - 2.4 mg/dL    Comment: Performed at [facility], [place] [address], [place], [place] [address]  TSH     Status: None   Collection Time: 08/11/18  4:07 AM  Result Value Ref Range   TSH 2.262 0.350 - 4.500 uIU/mL    Comment: Performed by a 3rd Generation assay with a functional sensitivity of <=0.01 uIU/mL. Performed at [facility], [place] [address], [place], [place] [address]   Troponin I - Now Then Q6H     Status: Abnormal   Collection Time: 08/11/18  4:07 AM  Result Value Ref Range   Troponin I 0.06 (HH) <0.03 ng/mL    Comment: CRITICAL RESULT CALLED TO, READ BACK BY AND VERIFIED WITH: ,DOSTER,T RN _0  ON 08/11/2018 JACKSON,K Performed at [facility], [place] [address], [place], [place] [address]   D-dimer, quantitative (not at [facility])     Status: Abnormal   Collection Time: 08/11/18  4:07 AM  Result Value Ref Range   D-Dimer, Quant 0.80 (H) 0.00 - 0.50 ug/mL-FEU    Comment: (NOTE) At the manufacturer cut-off of 0.50 ug/mL FEU, this assay has been documented to exclude PE with a sensitivity and negative predictive value of 97 to 99%.  At this time, this assay has not been  approved by the FDA to exclude DVT/VTE. Results should be correlated with clinical presentation. Performed at [facility], [place] [address], [place], [place] [address]   Troponin I - Now Then Q6H     Status: Abnormal   Collection Time: 08/11/18 10:28 AM  Result Value Ref Range   Troponin I 0.05 (HH) <0.03 ng/mL    Comment: CRITICAL VALUE NOTED.  VALUE IS CONSISTENT WITH PREVIOUSLY REPORTED AND CALLED VALUE. Performed at [facility], [place] [address] [place], [place] [address]   Troponin I - Now Then Q6H     Status: Abnormal  Collection Time: 08/11/18  3:30 PM  Result Value Ref Range   Troponin I 0.05 (HH) <0.03 ng/mL    Comment: CRITICAL VALUE NOTED.  VALUE IS CONSISTENT WITH PREVIOUSLY REPORTED AND CALLED VALUE. Performed at [facility], [place] [address] [place], [place] [address]   Basic metabolic panel     Status: Abnormal   Collection Time: 08/11/18  3:30 PM  Result Value Ref Range   Sodium 132 (L) 135 - 145 mmol/L   Potassium 3.8 3.5 - 5.1 mmol/L    Comment: DELTA CHECK NOTED NO VISIBLE HEMOLYSIS REPEATED TO VERIFY    Chloride 94 (L) 98 - 111 mmol/L   CO2 28 22 - 32 mmol/L   Glucose, Bld 146 (H) 70 - 99 mg/dL   BUN 57 (H) 6 - 20 mg/dL   Creatinine, Ser 1.45 (H) 0.44 - 1.00 mg/dL   Calcium 8.5 (L) 8.9 - 10.3 mg/dL   GFR calc non Af Amer 42 (L) >60 mL/min   GFR calc Af Amer 49 (L) >60 mL/min   Anion gap 10 5 - 15    Comment: Performed at [facility], [place] [address] [place], [place] [address]   Dg Chest 2 View  Result Date: 08/10/2018 CLINICAL DATA:  Chest pain, shortness of breath EXAM: CHEST - 2 VIEW COMPARISON:  CTA chest dated 02/23/2015 FINDINGS: Increased interstitial markings, chronic, likely reflecting chronic interstitial lung disease when correlating with prior CT. No focal consolidation. No pleural effusion or pneumothorax. Cardiomegaly. Degenerative changes of the visualized  thoracolumbar spine. IMPRESSION: Chronic interstitial lung disease. Cardiomegaly. Electronically Signed   By: Julian Hy M.D.   On: 08/10/2018 21:19    Pending Labs Unresulted Labs (From admission, onward)    Start     Ordered   08/12/18 6440  Basic metabolic panel  Daily,   R    Question:  Specimen collection method  Answer:  Lab=Lab collect   08/11/18 0829   08/12/18 0500  Magnesium  Daily,   R    Question:  Specimen collection method  Answer:  Lab=Lab collect   08/11/18 0829          Vitals/Pain Today's Vitals   08/11/18 1430 08/11/18 1500 08/11/18 1530 08/11/18 1653  BP: 100/60 103/72 (!) 94/54   Pulse: 92 90 76   Resp: (!) 24 (!) 25 (!) 21   Temp:      TempSrc:      SpO2: 97% 98% 98%   PainSc:    0-No pain    Isolation Precautions No active isolations  Medications Medications  febuxostat (ULORIC) tablet 40 mg (40 mg Oral Given 08/11/18 1005)  tapentadol (NUCYNTA) tablet 100 mg (100 mg Oral Given 08/11/18 1021)  hydroxychloroquine (PLAQUENIL) tablet 400 mg (400 mg Oral Given 08/11/18 1012)  metolazone (ZAROXOLYN) tablet 5 mg (5 mg Oral Given 08/11/18 1012)  sildenafil (REVATIO) tablet 20 mg (20 mg Oral Given 08/11/18 1012)  simvastatin (ZOCOR) tablet 20 mg (20 mg Oral Given 08/11/18 1007)  ramelteon (ROZEREM) tablet 8 mg (has no administration in time range)  traZODone (DESYREL) tablet 50 mg (has no administration in time range)  levothyroxine (SYNTHROID, LEVOTHROID) tablet 37.5 mcg (37.5 mcg Oral Given 08/11/18 1014)  predniSONE (DELTASONE) tablet 7.5 mg (7.5 mg Oral Given 08/11/18 1010)  iron polysaccharides (NIFEREX) capsule 150 mg (150 mg Oral Given 08/11/18 1008)  mycophenolate (CELLCEPT) capsule 1,500 mg (1,500 mg Oral Not Given 08/11/18 1014)  gabapentin (NEURONTIN) capsule 900 mg (900 mg Oral  Given 08/11/18 1006)  tiZANidine (ZANAFLEX) tablet 4-12 mg (has no administration in time range)  calcium carbonate (OS-CAL - dosed in mg of elemental calcium) tablet 500  mg of elemental calcium (500 mg of elemental calcium Oral Given 08/11/18 1008)  potassium chloride SA (K-DUR,KLOR-CON) CR tablet 20 mEq (20 mEq Oral Given 08/11/18 1006)  albuterol (PROVENTIL) (2.5 MG/3ML) 0.083% nebulizer solution 2.5 mg (has no administration in time range)  loratadine (CLARITIN) tablet 10 mg (10 mg Oral Given 08/11/18 1007)  montelukast (SINGULAIR) tablet 10 mg (10 mg Oral Given 08/11/18 1011)  tacrolimus (PROTOPIC) 0.1 % ointment 1 application (has no administration in time range)  lidocaine (XYLOCAINE) 5 % ointment 2 application (has no administration in time range)  acetaminophen (TYLENOL) tablet 650 mg (has no administration in time range)    Or  acetaminophen (TYLENOL) suppository 650 mg (has no administration in time range)  ondansetron (ZOFRAN) tablet 4 mg ( Oral See Alternative 08/11/18 1214)    Or  ondansetron (ZOFRAN) injection 4 mg (4 mg Intravenous Given 08/11/18 1214)  furosemide (LASIX) injection 80 mg (80 mg Intravenous Given 08/11/18 1015)  budesonide (PULMICORT) nebulizer solution 0.25 mg (0.25 mg Nebulization Given 08/11/18 0727)  colchicine tablet 0.6 mg (0.6 mg Oral Given 08/11/18 1013)  probenecid (BENEMID) tablet 500 mg (500 mg Oral Given 08/11/18 1036)  Treprostinil (TYVASO) inhalation solution 18 mcg (18 mcg Inhalation Given by Other 08/11/18 1525)  sodium chloride flush (NS) 0.9 % injection 3 mL (3 mLs Intravenous Given 08/10/18 2354)  furosemide (LASIX) injection 80 mg (80 mg Intravenous Given 08/10/18 2351)  potassium chloride SA (K-DUR,KLOR-CON) CR tablet 40 mEq (40 mEq Oral Given 08/11/18 0727)  potassium chloride (K-DUR,KLOR-CON) CR tablet 30 mEq (30 mEq Oral Given 08/11/18 1259)    Mobility walks

## 2018-08-11 NOTE — H&P (Signed)
History and Physical    Christine Khan ILN:[id number] DOB: 16-Apr-1971 DOA: 08/10/2018  PCP: [name], NP  Patient coming from: Home.  Chief Complaint: Shortness of breath.  HPI: Christine Khan is a 48 y.o. 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  Patient states over the last 3 days patient has become more progressively short of breath than usual.  Has been having some pleuritic type of chest pain.  Denies any fever chills productive cough nausea vomiting or diarrhea.  Patient usually uses oxygen of around 3 to 6 L at home.  Patient states recently her Bactrim dose was decreased.  ED Course: In the ER patient was hypoxic and chest x-ray done showed features concerning for chronic interstitial lung disease and cardiomegaly.  BNP was about 1001.  Troponin initially was negative.  EKG shows diffuse T wave changes.  Patient has weakness on the lower extremity which patient states is chronic and he is being followed up.  Patient hemoglobin was around 8 and appears to be at baseline.  Patient did receive some blood transfusion 3 weeks ago.  Patient was given Lasix 80 mg IV in the ER and admitted for further management.  Review of Systems: As per HPI, rest all negative.   Past Medical History:  Diagnosis Date  . Anemia   . Chronic respiratory failure ([place])   . CKD (chronic kidney disease) stage 3, GFR 30-59 ml/min (HCC)   . Cor pulmonale (chronic) ([place])   . CREST syndrome ([place])   . Fibrosis of lung ([place])   . Lupus ([place])   . Lupus ([place])     History reviewed. No pertinent  surgical history.   reports that she has never smoked. She has never used smokeless tobacco. She reports that she does not drink alcohol or use drugs.  Allergies  Allergen Reactions  . Nitroglycerin Other (See Comments) and Rash    Pt taking sildenafil Pt taking sildenafil   . Zolpidem     Other reaction(s): Dizziness (intolerance), Hallucination Other reaction(s): Dizziness (intolerance)   . Enoxaparin Itching  . Heparin Itching    Family History  Problem Relation Age of Onset  . CAD Mother   . Diabetes Mellitus II Father     Prior to Admission medications   Medication Sig Start Date End Date Taking? Authorizing Provider  albuterol (PROVENTIL HFA;VENTOLIN HFA) 108 (90 BASE) MCG/ACT inhaler Inhale 2 puffs into the lungs every 6 (six) hours as needed. For shortness of breath   Yes [provider]  albuterol (PROVENTIL) (2.5 MG/3ML) 0.083% nebulizer solution Take 2.5 mg by nebulization every 6 (six) hours as needed. For shortness of breath   Yes [provider]  beclomethasone (QVAR) 80 MCG/ACT inhaler Inhale 1 puff into the lungs 2 (two) times daily. For shortness of  breath    Yes [provider]  bumetanide (BUMEX) 2 MG tablet Take 2 mg by mouth 2 (two) times daily. 06/09/18  Yes [provider]  calcium carbonate (CALCIUM 500) 1250 MG tablet Take 1 tablet by mouth 2 (two) times daily.   Yes [provider]  cetirizine (ZYRTEC) 10 MG tablet  Take 10 mg by mouth daily.   Yes [provider]  colchicine-probenecid 0.5-500 MG tablet Take 1 tablet by mouth daily. 08/03/18  Yes [provider]  cyanocobalamin (,VITAMIN B-12,) 1000 MCG/ML injection Inject 1,000 mcg into the muscle every 30 (thirty) days. 02/21/15  Yes [provider]  febuxostat (ULORIC) 40 MG tablet Take 40 mg by mouth daily. 12/27/17  Yes [provider]  gabapentin (NEURONTIN) 300 MG capsule Take 900 mg by mouth 2 (two) times daily. 07/25/18   Yes [provider]  hydroxychloroquine (PLAQUENIL) 200 MG tablet Take 400 mg by mouth 2 (two) times daily.    Yes [provider]  Iron-Folic Acid-Vit P82 (IRON FORMULA PO) Take 1 tablet by mouth daily.   Yes [provider]  levothyroxine (SYNTHROID, LEVOTHROID) 75 MCG tablet Take 37.5 mcg by mouth daily. 07/25/18  Yes [provider]  lidocaine (XYLOCAINE) 5 % ointment Apply 2 g topically 4 (four) times daily as needed for pain. 07/25/18  Yes [provider]  metolazone (ZAROXOLYN) 5 MG tablet Take 5 mg by mouth every other day. If needed for swelling 06/09/18  Yes [provider]  montelukast (SINGULAIR) 10 MG tablet Take 10 mg by mouth daily.   Yes [provider]  mycophenolate (CELLCEPT) 200 MG/ML suspension Take 7.5 mLs by mouth 2 (two) times daily. 07/25/18  Yes [provider]  NUCYNTA 100 MG TABS Take 100 mg by mouth every 6 (six) hours as needed for pain. 07/27/18  Yes [provider]  potassium chloride SA (K-DUR,KLOR-CON) 20 MEQ tablet Take 20 mEq by mouth daily.   Yes [provider]  predniSONE (DELTASONE) 5 MG tablet Take 7.5 mg by mouth daily. Continuous 07/25/18  Yes [provider]  ramelteon (ROZEREM) 8 MG tablet Take 8 mg by mouth at bedtime as needed for sleep. 06/28/18  Yes [provider]  sildenafil (REVATIO) 20 MG tablet Take 20 mg by mouth 3 (three) times daily. 05/27/15  Yes [provider]  simvastatin (ZOCOR) 20 MG tablet Take 20 mg by mouth daily. 07/25/18  Yes [provider]  sulfamethoxazole-trimethoprim (BACTRIM DS,SEPTRA DS) 800-160 MG tablet Take 1 tablet by mouth as directed. Three times a week 04/11/17  Yes [provider]  tacrolimus (PROTOPIC) 0.1 % ointment Apply 1 application topically 2 (two) times daily.   Yes [provider]  tiZANidine (ZANAFLEX) 4 MG tablet Take 4-12 mg by mouth at bedtime as needed for muscle  spasms. 07/25/18  Yes [provider]  traZODone (DESYREL) 50 MG tablet Take 50 mg by mouth at bedtime as needed for sleep. 06/29/16  Yes [provider]  cephALEXin (KEFLEX) 500 MG capsule Take 1 capsule (500 mg total) by mouth 2 (two) times daily. Patient not taking: Reported on 08/10/2018 02/24/15   Elnora Morrison, MD    Physical Exam: Vitals:   08/11/18 0030 08/11/18 0100 08/11/18 0130 08/11/18 0200  BP: 110/68 (!) 87/61 100/63 108/66  Pulse: (!) 152 73 74 81  Resp: 17 (!) 26 (!) 23 16  Temp:      TempSrc:      SpO2: 96% 100% 100% 100%      Constitutional: Moderately built and nourished. Vitals:   08/11/18 0030 08/11/18 0100 08/11/18 0130 08/11/18 0200  BP: 110/68 (!) 87/61 100/63 108/66  Pulse: (!) 152 73 74 81  Resp: 17 (!) 26 (!) 23 16  Temp:      TempSrc:      SpO2:  96% 100% 100% 100%   Eyes: Anicteric no pallor. ENMT: No discharge from the ears eyes nose or mouth. Neck: No mass felt.  No neck rigidity. Respiratory: No rhonchi mild crepitations. Cardiovascular: S1-S2 heard. Abdomen: Soft nontender bowel sounds present. Musculoskeletal: Edematous appears to be chronic.  Nonpitting. Skin: No rash. Neurologic: Alert awake oriented to time place and person.  Patient has difficulty moving her right leg because of weakness which as per the patient is chronic. Psychiatric: Appears normal.   Labs on Admission: I have personally reviewed following labs and imaging studies  CBC: Recent Labs  Lab 08/10/18 2055  WBC 5.5  HGB 8.6*  HCT 28.7*  MCV 92.0  PLT 756   Basic Metabolic Panel: Recent Labs  Lab 08/10/18 2055  NA 131*  K 3.3*  CL 92*  CO2 25  GLUCOSE 128*  BUN 58*  CREATININE 1.47*  CALCIUM 8.9   GFR: CrCl cannot be calculated (Unknown ideal weight.). Liver Function Tests: Recent Labs  Lab 08/10/18 2354  AST 20  ALT 8  ALKPHOS 56  BILITOT 0.7  PROT 7.5  ALBUMIN 4.7   No results for input(s): LIPASE, AMYLASE in the last 168  hours. No results for input(s): AMMONIA in the last 168 hours. Coagulation Profile: No results for input(s): INR, PROTIME in the last 168 hours. Cardiac Enzymes: No results for input(s): CKTOTAL, CKMB, CKMBINDEX, TROPONINI in the last 168 hours. BNP (last 3 results) No results for input(s): PROBNP in the last 8760 hours. HbA1C: No results for input(s): HGBA1C in the last 72 hours. CBG: No results for input(s): GLUCAP in the last 168 hours. Lipid Profile: No results for input(s): CHOL, HDL, LDLCALC, TRIG, CHOLHDL, LDLDIRECT in the last 72 hours. Thyroid Function Tests: Recent Labs    08/10/18 2354  TSH 2.316   Anemia Panel: No results for input(s): VITAMINB12, FOLATE, FERRITIN, TIBC, IRON, RETICCTPCT in the last 72 hours. Urine analysis:    Component Value Date/Time   COLORURINE YELLOW 02/23/2015 2313   APPEARANCEUR CLEAR 02/23/2015 2313   LABSPEC 1.014 02/23/2015 2313   PHURINE 6.0 02/23/2015 2313   GLUCOSEU NEGATIVE 02/23/2015 2313   HGBUR NEGATIVE 02/23/2015 2313   BILIRUBINUR NEGATIVE 02/23/2015 2313   KETONESUR NEGATIVE 02/23/2015 2313   PROTEINUR NEGATIVE 02/23/2015 2313   UROBILINOGEN 0.2 02/23/2015 2313   NITRITE NEGATIVE 02/23/2015 2313   LEUKOCYTESUR SMALL (A) 02/23/2015 2313   Sepsis Labs: _0 (procalcitonin:4,lacticidven:4) )No results found for this or any previous visit (from the past 240 hour(s)).   Radiological Exams on Admission: Dg Chest 2 View  Result Date: 08/10/2018 CLINICAL DATA:  Chest pain, shortness of breath EXAM: CHEST - 2 VIEW COMPARISON:  CTA chest dated 02/23/2015 FINDINGS: Increased interstitial markings, chronic, likely reflecting chronic interstitial lung disease when correlating with prior CT. No focal consolidation. No pleural effusion or pneumothorax. Cardiomegaly. Degenerative changes of the visualized thoracolumbar spine. IMPRESSION: Chronic interstitial lung disease. Cardiomegaly. Electronically Signed   By: Julian Hy  M.D.   On: 08/10/2018 21:19    EKG: Independently reviewed.  Sinus tachycardia with diffuse T wave changes.  Assessment/Plan Principal Problem:   Acute on chronic respiratory failure with hypoxia (HCC) Active Problems:   Acute CHF (congestive heart failure) (HCC)   CKD (chronic kidney disease) stage 3, GFR 30-59 ml/min (HCC)   Lupus (HCC)   Hypothyroidism   Cor pulmonale ([place])    1. 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  Patient was given Lasix 80 mg IV and have dosed at q. 12 hourly.  Closely follow intake output metabolic panel daily weights.  Patient also has some pleuritic type of chest pain will check d-dimer and troponins. 2. History of lupus and crest syndrome on Plaquenil prednisone and CellCept. 3. Chronic anemia on iron supplements and gets B12 injections.  Has received blood transfusion 3 weeks ago.  Follow CBC. 4. Chronic kidney disease stage III being followed by nephrologist as outpatient recent visit was on August 03, 2017.  Creatinine appears to be at baseline.  Follow closely. 5. Hypothyroidism on Synthroid. 6. Hyperlipidemia on statins. 7. History of gout on Uloric and colchicine.   DVT prophylaxis: Patient is allergic to Lovenox and heparin.  On SCDs. Code Status: Full code. Family Communication: Discussed with patient. Disposition Plan: Home. Consults called: None. Admission status: Inpatient.   Rise Patience MD [facility] Pager [phone].  If 7PM-7AM, please contact night-coverage [email or web address] Password [facility]  08/11/2018, 2:31 AM

## 2018-08-11 NOTE — Progress Notes (Addendum)
Patient admitted by Dr Hal Hope this morning for CHF exac and fluids overload.   48 year old female with chronic respiratory failure, pulmonary hypertension from lupus and crest, cor pulmonale hyperlipidemia, CKD stage III-4, chronic anemia came to the hospital for extremity weakness and shortness of breath.  Patient has quite extensive and complex medical history.  Upon admission she was noted to have elevated BNP.  Therefore started on aggressive diuresis with Lasix 80 mg IV twice daily.  Dimer elevated at 0.8, apparently this chronically remained very visible.  Labs showed hypokalemia which was repleted.  When I saw the patient in the ER she was resting on the bed, mother was at the bedside.  Vital signs are relatively stable but at times as soft blood pressure with systolic of 71I. Bibasilar crackles with 3+ bilateral lower extremity pitting edema.  Chest x-ray showed chronic interstitial lung disease with cardiomegaly.  For now we will continue diuresing the patient with caution as she is quite preload dependent. Replete K aggressively. Repeat Lab work.   Patient's RN was at bedside during my evaluation, case discussed with her as well.  Please call with further questions as necessary.  Overall due to multiple comorbidities and poor quality of life, she would eventually benefit from palliative care discussion.  This can be performed with the assistance of her primary care provider.  Gerlean Ren MD [facility] [email or web address]

## 2018-08-11 NOTE — ED Provider Notes (Signed)
Medical screening examination/treatment/procedure(s) were conducted as a shared visit with non-physician practitioner(s) and myself.  I personally evaluated the patient during the encounter. Briefly, the patient is a 48 y.o. female with history of lupus, pulmonary fibrosis, anemia of chronic disease, CKD, hypertension, reflux, heart failure, chronic respiratory failure on 2 to 4 L of oxygen who presents to the ED with shortness of breath.  Patient states that she has had about a 10 pound weight gain over the last several days.  Has had increasing home oxygen requirements to 6 to 8 L especially with movement.  Patient denies any fever, sputum production.  Has had a dry cough.  Has increasing swelling of her legs.  Patient is on chronic steroids.  Has signs of volume overload on exam.  Overall is chronically ill-appearing.  Concern for possible heart failure versus pneumonia versus reactive airway disease/worsening of lupus.  EKG shows sinus tachycardia.  No signs of ischemic changes.  Troponin within normal limits.  Hemoglobin mildly decreased to 8.6.  No melena, no hematochezia.  Creatinine at baseline.  No significant electrolyte abnormalities otherwise.  No obvious new findings on chest x-ray.  No pneumonia, no pneumothorax.  Continued cardiomegaly.  Overall patient with increasing oxygen requirement.  Suspect likely from volume overload.  Given IV Lasix.  Admitted to medicine for further investigation and care.  This chart was dictated using voice recognition software.  Despite best efforts to proofread,  errors can occur which can change the documentation meaning.    EKG Interpretation  Date/Time:  Thursday August 10 2018 20:31:41 EST Ventricular Rate:  100 PR Interval:    QRS Duration: 105 QT Interval:  357 QTC Calculation: 461 R Axis:   85 Text Interpretation:  Sinus tachycardia Probable left atrial enlargement Abnormal R-wave progression, early transition ST-t wave abnormality Artifact  Abnormal ekg Confirmed by Carmin Muskrat [phone]) on 08/10/2018 10:29:55 PM            Lennice Sites, DO 08/11/18 0019

## 2018-08-11 NOTE — Progress Notes (Signed)
Patient unable to tolerate cellcept capsules. She states that the one she tried got stuck in her throat. She states that she is going to have her sister bring in her cellcept suspension from home.

## 2018-08-11 NOTE — Progress Notes (Signed)
CRITICAL VALUE ALERT  Critical Value:  Troponin 0.06, Elevated d-dimer 0.80  Date & Time Notied:  08-11-18 0450  Provider Notified: X. Blount  Orders Received/Actions taken:

## 2018-08-12 LAB — BASIC METABOLIC PANEL
Anion gap: 14 (ref 5–15)
BUN: 55 mg/dL — ABNORMAL HIGH (ref 6–20)
CHLORIDE: 93 mmol/L — AB (ref 98–111)
CO2: 27 mmol/L (ref 22–32)
Calcium: 9.1 mg/dL (ref 8.9–10.3)
Creatinine, Ser: 1.46 mg/dL — ABNORMAL HIGH (ref 0.44–1.00)
GFR calc Af Amer: 49 mL/min — ABNORMAL LOW (ref >60)
GFR calc non Af Amer: 42 mL/min — ABNORMAL LOW (ref >60)
Glucose, Bld: 103 mg/dL — ABNORMAL HIGH (ref 70–99)
Potassium: 3.4 mmol/L — ABNORMAL LOW (ref 3.5–5.1)
Sodium: 134 mmol/L — ABNORMAL LOW (ref 135–145)

## 2018-08-12 LAB — MAGNESIUM: MAGNESIUM: 2 mg/dL (ref 1.7–2.4)

## 2018-08-12 MED ORDER — HYDROXYCHLOROQUINE SULFATE 200 MG PO TABS
200.0000 mg | ORAL_TABLET | Freq: Two times a day (BID) | ORAL | Status: DC
  Administered 2018-08-13 – 2018-08-19 (×13): 200 mg via ORAL
  Filled 2018-08-12 (×13): qty 1

## 2018-08-12 NOTE — Progress Notes (Addendum)
[facility] PROGRESS NOTE  Meekah Math HCW:[id number] DOB: Jul 16, 1971 DOA: 08/10/2018 PCP: [name], NP   Narrative: 48 year old female Pulmonary hypertension 2/2 crest syndrome/lupus (followed by Dr. Jerilynn Mages. [facility]) on CellCept and Plaquenil = cor pulmonale Last echo 12/2017 normal EF grade 1 diastolic dysfunction severely dilated RV/RA severe TR severe pulmonary hypertension Baseline weight 211 Chronic respiratory failure on 3 to 6 L of oxygen OHSS Sickle cell trait Hypothyroid Hyperlipidemia Chronic pain on Nucynta Hypothyroid Severe ILD CKD 3 with baseline creatinine 1.6-felt to be secondary to cardiorenal syndrome with cor pulmonale (followed by Dr. Gerarda Gunther) Chronic anemia  Previously admitted 02/2018, 04/2018 acute respiratory failure-at that time diuresed had a VQ scan low probability PE  Came to emergency room at [facility] long 1/17 shortness of breath imaging showed chronic interstitial lung disease cardiomegaly BNP 1000 troponins negative EKG diffuse changes with overall diffuse weakness hemoglobin in the 8 range Patient was started on diuretics  A & Plan Acute hypoxic respiratory failure superimposed secondary to heart failure predominantly in the setting of ILD on chronic respiratory failure Shortness of breath is seemingly improved She has a negative fluid balance today We will try to get her up and about with therapy later on today Continuing metolazone 5 q. other day, Lasix 80 IV twice daily Home meds Bumex 2 twice daily History of lupus and crest syndrome Continue Plaquenil 400 twice daily, CellCept 7.5 mils twice daily, prednisone 7.5 daily Will need follow-up as an outpatient with her rheumatologist Dr. Posey Pronto Cor pulmonale-baseline weight 211 today she is in the 220-223 range Continue diuretics as above-disposition difficult because of cardiorenal syndrome Constitutional hypotension Patient is preload dependent has low blood pressure at  baseline have to be careful with medications and may benefit at some point from subspecialty cardiology seeing her-she has [facility] the [facility] hypokalemia secondary to diuresis Continue potassium 20 daily recheck labs in a.m. CKD 3 Baseline creatinine posterior 1.3-1.5 currently stable-diurese as appropriate Hypothyroid Continue Synthroid 37 mcg daily will need recheck TSH 1 month Chronic pain on Nucynta Continue above meds, Neurontin 900 twice daily History of ILD  likely secondary to rheumatological process-continue Revatio 20 3 times daily, treprostinil 18 mcg 4 times daily inhalations-- OHSS-not a candidate for CPAP mask does not use? 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 Baseline hemoglobin 8 range and currently stable-check stores and as an outpatient-continue Niferex-150 daily History of gout Continue colchicine probenecid combination tablet, Uloric 40 daily Chronically elevated d-dimer No concerns at this time that this is PE BMI 39 Insomnia Continue Rozerem 8 at bedtime, trazodone 50 at bedtime as needed  DVT SCD code Status: Full code communication: Mother discussed plan with her at bedside she has been living with mother for the past 5 years she is pretty frail and weak and at baseline uses a power chair to mobilize for the past couple of years we will ask therapy to evaluate her disposition Plan: At this time inpatient   Verlon Au, MD  [facility] [email or web address] 7PM-7AM contact night coverage as above 08/12/2018, 7:36 AM  LOS: 2 days   Consultants:  None  Procedures:  No  Antimicrobials:  No  Interval history/Subjective: Awake but slightly listless on chronic oxygen Looks ill States swelling overall seems better and to the mother it seems better than previously No chest pain No fevers overnight No shortness of breath  Objective:  Vitals:  Vitals:    08/12/18  0524 08/12/18 0528  BP: (!) 91/55 (!) 91/55  Pulse: (!) 48 95  Resp: 16 16  Temp: 98.2 F (36.8 C) 98.2 F (36.8 C)  SpO2: (!) 70% 96%    Exam:  Cushingoid appearance No icterus no pallor thick neck on nasal cannula oxygen S1-S2 audible no murmur rub or gallop Crackly in the lungs bilaterally posteriorly with elevated JVD on exam lower extremity swelling is noted Neurologically intact but overall weak No rash Psych is flat affect    I have personally reviewed the following:  DATA   Labs:  BUN/creatinine 55/1.4 at usual baseline, potassium 3.4  Troponin 0 0.05 trend flat  I/oh -1250   Scheduled Meds: . budesonide (PULMICORT) nebulizer solution  0.25 mg Nebulization BID  . calcium carbonate  1 tablet Oral BID  . colchicine  0.6 mg Oral Daily  . febuxostat  40 mg Oral Daily  . furosemide  80 mg Intravenous Q12H  . gabapentin  900 mg Oral BID  . hydroxychloroquine  400 mg Oral BID  . iron polysaccharides  150 mg Oral Daily  . levothyroxine  37.5 mcg Oral Q0600  . loratadine  10 mg Oral Daily  . metolazone  5 mg Oral QODAY  . montelukast  10 mg Oral Daily  . mycophenolate  1,500 mg Oral BID  . potassium chloride SA  20 mEq Oral Daily  . predniSONE  7.5 mg Oral Daily  . probenecid  500 mg Oral Daily  . sildenafil  20 mg Oral TID  . simvastatin  20 mg Oral Daily  . tacrolimus  1 application Topical BID  . Treprostinil  18 mcg Inhalation QID   Continuous Infusions:  Principal Problem:   Acute on chronic respiratory failure with hypoxia (HCC) Active Problems:   Acute CHF (congestive heart failure) (HCC)   CKD (chronic kidney disease) stage 3, GFR 30-59 ml/min (HCC)   Lupus (HCC)   Hypothyroidism   Cor pulmonale (HCC)   LOS: 2 days

## 2018-08-13 LAB — CBC WITH DIFFERENTIAL/PLATELET
ABS IMMATURE GRANULOCYTES: 0.08 10*3/uL — AB (ref 0.00–0.07)
Basophils Absolute: 0 10*3/uL (ref 0.0–0.1)
Basophils Relative: 1 %
Eosinophils Absolute: 0.3 10*3/uL (ref 0.0–0.5)
Eosinophils Relative: 4 %
HCT: 27.4 % — ABNORMAL LOW (ref 36.0–46.0)
HEMOGLOBIN: 8.1 g/dL — AB (ref 12.0–15.0)
Immature Granulocytes: 1 %
Lymphocytes Relative: 9 %
Lymphs Abs: 0.6 10*3/uL — ABNORMAL LOW (ref 0.7–4.0)
MCH: 27 pg (ref 26.0–34.0)
MCHC: 29.6 g/dL — ABNORMAL LOW (ref 30.0–36.0)
MCV: 91.3 fL (ref 80.0–100.0)
MONOS PCT: 11 %
Monocytes Absolute: 0.7 10*3/uL (ref 0.1–1.0)
Neutro Abs: 5 10*3/uL (ref 1.7–7.7)
Neutrophils Relative %: 74 %
Platelets: 310 10*3/uL (ref 150–400)
RBC: 3 MIL/uL — ABNORMAL LOW (ref 3.87–5.11)
RDW: 15.9 % — ABNORMAL HIGH (ref 11.5–15.5)
WBC: 6.7 10*3/uL (ref 4.0–10.5)
nRBC: 0 % (ref 0.0–0.2)

## 2018-08-13 LAB — BASIC METABOLIC PANEL
Anion gap: 13 (ref 5–15)
BUN: 55 mg/dL — ABNORMAL HIGH (ref 6–20)
CALCIUM: 9.3 mg/dL (ref 8.9–10.3)
CHLORIDE: 92 mmol/L — AB (ref 98–111)
CO2: 28 mmol/L (ref 22–32)
Creatinine, Ser: 1.43 mg/dL — ABNORMAL HIGH (ref 0.44–1.00)
GFR calc Af Amer: 50 mL/min — ABNORMAL LOW (ref >60)
GFR, EST NON AFRICAN AMERICAN: 43 mL/min — AB (ref >60)
Glucose, Bld: 95 mg/dL (ref 70–99)
Potassium: 2.9 mmol/L — ABNORMAL LOW (ref 3.5–5.1)
Sodium: 133 mmol/L — ABNORMAL LOW (ref 135–145)

## 2018-08-13 LAB — MAGNESIUM: Magnesium: 2.1 mg/dL (ref 1.7–2.4)

## 2018-08-13 MED ORDER — ALUM & MAG HYDROXIDE-SIMETH 200-200-20 MG/5ML PO SUSP
30.0000 mL | ORAL | Status: DC | PRN
  Administered 2018-08-13: 30 mL via ORAL
  Filled 2018-08-13: qty 30

## 2018-08-13 MED ORDER — POTASSIUM CHLORIDE CRYS ER 20 MEQ PO TBCR
40.0000 meq | EXTENDED_RELEASE_TABLET | Freq: Two times a day (BID) | ORAL | Status: DC
  Administered 2018-08-13 – 2018-08-19 (×13): 40 meq via ORAL
  Filled 2018-08-13 (×13): qty 2

## 2018-08-13 MED ORDER — FUROSEMIDE 10 MG/ML IJ SOLN
8.0000 mg/h | INTRAVENOUS | Status: DC
  Administered 2018-08-13 – 2018-08-15 (×2): 6 mg/h via INTRAVENOUS
  Administered 2018-08-16 – 2018-08-17 (×2): 8 mg/h via INTRAVENOUS
  Filled 2018-08-13: qty 25
  Filled 2018-08-13 (×4): qty 20

## 2018-08-13 NOTE — Evaluation (Signed)
Physical Therapy Evaluation Patient Details Name: Christine Khan MRN: [id number] DOB: 1971-01-24 Today's Date: 08/13/2018   History of Present Illness  Christine Khan is a 48 y.o. 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  Clinical Impression  Pt admitted with above diagnosis. Pt currently with functional limitations due to the deficits listed below (see PT Problem List).  Pt will benefit from skilled PT to increase their independence and safety with mobility to allow discharge to the venue listed below.   Pt was agreeable to a limited PT eval due to being tired.  She moved better than expected with MIN A to transfer with dragging of R LE.  She has the equipment she needs, but feel she would benefit from some [facility] aides if possible.  Recommend HHPT.     Follow Up Recommendations Home health PT;Supervision/Assistance - 24 hour;Other (comment)(HH aides)    Equipment Recommendations  None recommended by PT    Recommendations for Other Services OT consult     Precautions / Restrictions Precautions Precautions: Fall      Mobility  Bed Mobility Overal bed mobility: Needs Assistance Bed Mobility: Supine to Sit;Sit to Supine     Supine to sit: Min guard Sit to supine: Mod assist   General bed mobility comments: Pt able to get to EOB with HOB raised and with use of rail. to return supine she needed A for LE  Transfers Overall transfer level: Needs assistance Equipment used: 1 person hand held assist Transfers: Stand Pivot Transfers   Stand pivot transfers: Min assist       General transfer comment: Pt transferred to [facility] (used as a chair) and then back to bed.  She required MIN A and noted R leg dragging with SPT and side stepping.  Ambulation/Gait             General Gait Details: NT  Stairs            Wheelchair Mobility    Modified Rankin (Stroke  Patients Only)       Balance Overall balance assessment: Needs assistance   Sitting balance-Leahy Scale: Fair       Standing balance-Leahy Scale: Poor                               Pertinent Vitals/Pain Pain Assessment: Faces Faces Pain Scale: Hurts even more Pain Location: "all over" Pain Intervention(s): Limited activity within patient's tolerance;Monitored during session    [place] expects to be discharged to:: Private residence Living Arrangements: Parent Available Help at Discharge: Family;Available 24 hours/day   Home Access: Level entry     Home Layout: One level Home Equipment: Walker - 4 wheels;Wheelchair - power;Bedside commode;Hospital bed      Prior Function Level of Independence: Needs assistance   Gait / Transfers Assistance Needed: on good days she transfers to electric w/c herself, on bad days, her mother A her.  ADL's / Homemaking Assistance Needed: mother assists her some with LB dressing, takes sponge baths        Hand Dominance        Extremity/Trunk Assessment   Upper Extremity Assessment Upper Extremity Assessment: Generalized weakness    Lower Extremity Assessment Lower Extremity Assessment: Generalized weakness;RLE deficits/detail RLE Deficits / Details: grossly 2 to 3-/5 RLE Sensation: decreased light touch;decreased proprioception RLE Coordination: decreased gross motor  Communication      Cognition Arousal/Alertness: Awake/alert Behavior During Therapy: WFL for tasks assessed/performed Overall Cognitive Status: Within Functional Limits for tasks assessed                                        General Comments      Exercises     Assessment/Plan    PT Assessment Patient needs continued PT services  PT Problem List Decreased strength;Decreased activity tolerance;Decreased balance;Decreased mobility       PT Treatment Interventions DME instruction;Therapeutic  activities;Functional mobility training;Balance training;Patient/family education;Neuromuscular re-education    PT Goals (Current goals can be found in the Care Plan section)  Acute Rehab PT Goals Patient Stated Goal: get some sleep PT Goal Formulation: With patient Time For Goal Achievement: 08/27/18 Potential to Achieve Goals: Good    Frequency Min 3X/week   Barriers to discharge        Co-evaluation               AM-PAC PT "6 Clicks" Mobility  Outcome Measure Help needed turning from your back to your side while in a flat bed without using bedrails?: A Little Help needed moving from lying on your back to sitting on the side of a flat bed without using bedrails?: A Lot Help needed moving to and from a bed to a chair (including a wheelchair)?: A Little Help needed standing up from a chair using your arms (e.g., wheelchair or bedside chair)?: A Little Help needed to walk in hospital room?: A Lot Help needed climbing 3-5 steps with a railing? : Total 6 Click Score: 14    End of Session Equipment Utilized During Treatment: Gait belt;Oxygen Activity Tolerance: Patient limited by fatigue Patient left: in bed;with bed alarm set;with call bell/phone within reach;with family/visitor present Nurse Communication: Mobility status PT Visit Diagnosis: Unsteadiness on feet (R26.81);Muscle weakness (generalized) (M62.81)    Time: 1544-1600 PT Time Calculation (min) (ACUTE ONLY): 16 min   Charges:   PT Evaluation $PT Eval Moderate Complexity: 1 Mod          Destaney Sarkis L. [place], [place] Pager [phone] 08/13/2018   Galen Manila 08/13/2018, 4:21 PM

## 2018-08-13 NOTE — Progress Notes (Signed)
Bed alarm on, patient called and requested it off, explain the reason the bed alarm is on but she sated because her mom is always with her and has taken care of her the last 5years and can help her to the [facility], sister and mom in the room and reinforced/agreed with patient.

## 2018-08-13 NOTE — Progress Notes (Signed)
[facility] PROGRESS NOTE  Christine Khan ASN:[id number] DOB: 01-Dec-1970 DOA: 08/10/2018 PCP: [name], NP   Narrative: 47 year old female Pulmonary hypertension 2/2 crest syndrome/lupus (followed by Dr. Jerilynn Mages. [facility]) on CellCept and Plaquenil = cor pulmonale Last echo 12/2017 normal EF grade 1 diastolic dysfunction severely dilated RV/RA severe TR severe pulmonary hypertension Baseline weight 211 Chronic respiratory failure on 3 to 6 L of oxygen OHSS Sickle cell trait Hypothyroid Hyperlipidemia Chronic pain on Nucynta Hypothyroid Severe ILD CKD 3 with baseline creatinine 1.6-felt to be secondary to cardiorenal syndrome with cor pulmonale (followed by Dr. Gerarda Gunther) Chronic anemia  Previously admitted 02/2018, 04/2018 acute respiratory failure-at that time diuresed had a VQ scan low probability PE  Came to emergency room at [facility] long 1/17 shortness of breath imaging showed chronic interstitial lung disease cardiomegaly BNP 1000 troponins negative EKG diffuse changes with overall diffuse weakness hemoglobin in the 8 range Patient was started on diuretics and is actively being diuresed  A & Plan Acute hypoxic respiratory failure superimposed secondary to heart failure predominantly in the setting of ILD on chronic respiratory failure Shortness of breath is seemingly improved negative fluid balance today Changing to lasix gtt 25m/hr History of lupus and crest syndrome Continue Plaquenil 400 twice daily, CellCept 7.5 mils twice daily, prednisone 7.5 daily Will need follow-up as an outpatient with her rheumatologist Dr. PPosey ProntoCor pulmonale-baseline weight 211 today she is in the 220-223 range Continue diuretics as above-disposition difficult because of cardiorenal syndrome Constitutional hypotension Patient is preload dependent has low blood pressure at baseline- may benefit at some point from subspecialty cardiology seeing her- hypokalemia secondary to  diuresis Low potassium-increase kdur to 40 bid-labs in am, check mag CKD 3 Baseline creatinine 1.3-1.5 currently stable-diurese as appropriate Hypothyroid Continue Synthroid 37 mcg daily will need recheck TSH 1 month Chronic pain on Nucynta Continue above meds, Neurontin 900 twice daily History of ILD  likely secondary to rheumatological process-continue Revatio 20 3 times daily, treprostinil 18 mcg 4 times daily inhalations OHSS-not a candidate for CPAP mask does not use? 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 Baseline hemoglobin 8 range and currently stable-check stores and as an outpatient-continue Niferex-150 daily History of gout Continue colchicine probenecid combination tablet, Uloric 40 daily Chronically elevated d-dimer No concerns at this time that this is PE BMI 39 Insomnia Continue Rozerem 8 at bedtime, trazodone 50 at bedtime as needed  DVT SCD code Status: Full code communication: discussed poc at bedside couple of years we will ask therapy to evaluate her disposition Plan: At this time inpatient--expect several more days diuresisi to get to basleine weight then likely can go home--up at bedisde and in room today    SVerlon Au MD  [facility] [email or web address] 7PM-7AM contact night coverage as above 08/13/2018, 1:53 PM  LOS: 3 days   Consultants:  None  Procedures:  No  Antimicrobials:  No  Interval history/Subjective:  Less sleepy Coherent in nad No fever no chills  breathing overall imrpoved  Objective:  Vitals:  Vitals:   08/12/18 2214 08/13/18 0651  BP: 113/60 108/65  Pulse: 70 89  Resp: (!) 24 20  Temp: 98.5 F (36.9 C) 98.2 F (36.8 C)  SpO2: 100% 100%    Exam:  Cushingoid  No icterus no pallor thick neck on nasal cannula oxygen S1-S2 audible no murmur rub or gallop Lungs sound clear Mild elevated JVD on exam  Neurologically intact, power 5/5 No rash Psych  is flat affect    I have personally reviewed the following:  DATA   Labs:  BUN/creatinine 55/1.4 at usual baseline, , K 2.9, Mag 2.1, Sod 133  Hemoglobin 8.1  Troponin 0 0.05 trend flat  I/oh -2.5  basline weight 211, today 218   Scheduled Meds: . budesonide (PULMICORT) nebulizer solution  0.25 mg Nebulization BID  . calcium carbonate  1 tablet Oral BID  . colchicine  0.6 mg Oral Daily  . febuxostat  40 mg Oral Daily  . gabapentin  900 mg Oral BID  . hydroxychloroquine  200 mg Oral BID  . iron polysaccharides  150 mg Oral Daily  . levothyroxine  37.5 mcg Oral Q0600  . loratadine  10 mg Oral Daily  . metolazone  5 mg Oral QODAY  . montelukast  10 mg Oral Daily  . mycophenolate  1,500 mg Oral BID  . potassium chloride  40 mEq Oral BID  . predniSONE  7.5 mg Oral Daily  . probenecid  500 mg Oral Daily  . sildenafil  20 mg Oral TID  . simvastatin  20 mg Oral Daily  . tacrolimus  1 application Topical BID  . Treprostinil  18 mcg Inhalation QID   Continuous Infusions: . furosemide (LASIX) infusion 6 mg/hr (08/13/18 1102)    Principal Problem:   Acute on chronic respiratory failure with hypoxia (HCC) Active Problems:   Acute CHF (congestive heart failure) (HCC)   CKD (chronic kidney disease) stage 3, GFR 30-59 ml/min (HCC)   Lupus (HCC)   Hypothyroidism   Cor pulmonale (HCC)   LOS: 3 days

## 2018-08-14 LAB — RENAL FUNCTION PANEL
Albumin: 4.1 g/dL (ref 3.5–5.0)
Anion gap: 13 (ref 5–15)
BUN: 52 mg/dL — ABNORMAL HIGH (ref 6–20)
CO2: 27 mmol/L (ref 22–32)
Calcium: 9.2 mg/dL (ref 8.9–10.3)
Chloride: 94 mmol/L — ABNORMAL LOW (ref 98–111)
Creatinine, Ser: 1.39 mg/dL — ABNORMAL HIGH (ref 0.44–1.00)
GFR calc Af Amer: 52 mL/min — ABNORMAL LOW (ref >60)
GFR calc non Af Amer: 45 mL/min — ABNORMAL LOW (ref >60)
Glucose, Bld: 108 mg/dL — ABNORMAL HIGH (ref 70–99)
POTASSIUM: 3.6 mmol/L (ref 3.5–5.1)
Phosphorus: 3.2 mg/dL (ref 2.5–4.6)
Sodium: 134 mmol/L — ABNORMAL LOW (ref 135–145)

## 2018-08-14 LAB — MAGNESIUM: Magnesium: 2 mg/dL (ref 1.7–2.4)

## 2018-08-14 LAB — CBC
HEMATOCRIT: 26.5 % — AB (ref 36.0–46.0)
Hemoglobin: 7.7 g/dL — ABNORMAL LOW (ref 12.0–15.0)
MCH: 27.2 pg (ref 26.0–34.0)
MCHC: 29.1 g/dL — ABNORMAL LOW (ref 30.0–36.0)
MCV: 93.6 fL (ref 80.0–100.0)
Platelets: 277 10*3/uL (ref 150–400)
RBC: 2.83 MIL/uL — AB (ref 3.87–5.11)
RDW: 15.9 % — ABNORMAL HIGH (ref 11.5–15.5)
WBC: 7 10*3/uL (ref 4.0–10.5)
nRBC: 0 % (ref 0.0–0.2)

## 2018-08-14 NOTE — Progress Notes (Signed)
Interventions for the episode of epistaxis:Cool compress applied to nose;now an ice pack to the forehead.

## 2018-08-14 NOTE — Progress Notes (Signed)
Patient having an episode of epistaxis. PCP will be notified

## 2018-08-14 NOTE — Progress Notes (Signed)
OT Cancellation Note  Patient Details Name: Christine Khan MRN: [id number] DOB: 1971-01-23   Cancelled Treatment:    Reason Eval/Treat Not Completed: Other (comment). Pt reports today is just a bad day. She was up a lot last night due to nose bleed and does not feel like doing anything today. Re-attempt eval tomorrow. [place], OTR/L Acute Rehab Services Pager [phone] Office [phone]     Almon Register 08/14/2018, 2:06 PM

## 2018-08-14 NOTE — Progress Notes (Signed)
The ice pack has stopped the nose bleed

## 2018-08-14 NOTE — Progress Notes (Signed)
[facility] PROGRESS NOTE  Verneta Hamidi HQP:[id number] DOB: 1970/09/11 DOA: 08/10/2018 PCP: [name], NP   Narrative: 48 year old female Pulmonary hypertension 2/2 crest syndrome/lupus (followed by Dr. Jerilynn Mages. [facility]) on CellCept and Plaquenil = cor pulmonale Last echo 12/2017 normal EF grade 1 diastolic dysfunction severely dilated RV/RA severe TR severe pulmonary hypertension Baseline weight 211 Chronic respiratory failure on 3 to 6 L of oxygen OHSS Sickle cell trait Hypothyroid Hyperlipidemia Chronic pain on Nucynta Hypothyroid Severe ILD CKD 3 with baseline creatinine 1.6-felt to be secondary to cardiorenal syndrome with cor pulmonale (followed by Dr. Gerarda Gunther) Chronic anemia  Previously admitted 02/2018, 04/2018 acute respiratory failure-at that time diuresed had a VQ scan low probability PE  Came to emergency room at [facility] long 1/17 shortness of breath imaging showed chronic interstitial lung disease cardiomegaly BNP 1000 troponins negative EKG diffuse changes with overall diffuse weakness hemoglobin in the 8 range Patient was started on diuretics and is actively being diuresed  A & Plan Acute hypoxic respiratory failure superimposed secondary to heart failure predominantly in the setting of ILD on chronic respiratory failure Shortness of breath is seemingly improved Continue Lasix gtt 79m/hr New onset epistaxis overnight 1/19 Nonsevere get CBC this morning Transfuse if below 7 threshold History of lupus and crest syndrome Continue Plaquenil 400 twice daily, CellCept 7.5 mils twice daily, prednisone 7.5 daily Will need follow-up as an outpatient with her rheumatologist Dr. PPosey ProntoCor pulmonale-baseline weight 211  Continue diuretics as above-will be several days prior to transition to orals Constitutional hypotension Patient is preload dependent has low blood pressure at baseline- may benefit at some point from subspecialty cardiology seeing  her- hypokalemia secondary to diuresis Resolved-continue replacement, magnesium good CKD 3 Baseline creatinine 1.3-1.5 currently stable-diurese as appropriate Hypothyroid Continue Synthroid 37 mcg daily will need recheck TSH 1 month Chronic pain on Nucynta Continue above meds, Neurontin 900 twice daily History of ILD  likely secondary to rheumatological process-continue Revatio 20 3 times daily, treprostinil 18 mcg 4 times daily inhalations OHSS-not a candidate for CPAP mask does not use? 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 Baseline hemoglobin 8 range worsened by epistaxis however still stable not meeting transfusion threshold History of gout Continue colchicine probenecid combination tablet, Uloric 40 daily Chronically elevated d-dimer No concerns at this time that this is PE BMI 39 Insomnia Continue Rozerem 8 at bedtime, trazodone 50 at bedtime as needed  DVT SCD code Status: Full code communication: discussed poc at bedside couple of years we will ask therapy to evaluate her disposition Plan: At this time inpatient--expect several more days diuresisi to get to basleine weight then likely can go home--up at bedisde and in room today    SVerlon Au MD  [facility] [email or web address] 7PM-7AM contact night coverage as above 08/14/2018, 11:46 AM  LOS: 4 days   Consultants:  None  Procedures:  No  Antimicrobials:  No  Interval history/Subjective:  He is poorly had several episodes of epistaxis wonder if this is related to her nasal oxygen Tells me he has had this in the past Bleeding is currently now stopped She is eating breakfast although appears more listless than prior  Objective:  Vitals:  Vitals:   08/14/18 0548 08/14/18 1051  BP: 94/63   Pulse: 90   Resp: 16   Temp: 98.6 F (37 C)   SpO2: 99% 97%    Exam:  Cushingoid, listless sleepy No icterus no pallor on nasal cannula  oxygen S1-S2 audible no murmur rub or gallop Chest is clear without added rales rhonchi or adventitious sounds Cannot appreciate JVD Neurologically intact, power 5/5 No rash Psych is flat affect    I have personally reviewed the following:  DATA   Labs:  BUN/creatinine 55/1.4-->52/1.3 at usual baseline,  , K 2.9-->now 3.6, Mag 2.0,  Hemoglobin 8.1 dropped to 7.7  I/oh 3.4 L  basline weight 211 [95 k], today 218 99.4   Scheduled Meds: . budesonide (PULMICORT) nebulizer solution  0.25 mg Nebulization BID  . calcium carbonate  1 tablet Oral BID  . colchicine  0.6 mg Oral Daily  . febuxostat  40 mg Oral Daily  . gabapentin  900 mg Oral BID  . hydroxychloroquine  200 mg Oral BID  . iron polysaccharides  150 mg Oral Daily  . levothyroxine  37.5 mcg Oral Q0600  . loratadine  10 mg Oral Daily  . metolazone  5 mg Oral QODAY  . montelukast  10 mg Oral Daily  . mycophenolate  1,500 mg Oral BID  . potassium chloride  40 mEq Oral BID  . predniSONE  7.5 mg Oral Daily  . probenecid  500 mg Oral Daily  . sildenafil  20 mg Oral TID  . simvastatin  20 mg Oral Daily  . tacrolimus  1 application Topical BID  . Treprostinil  18 mcg Inhalation QID   Continuous Infusions: . furosemide (LASIX) infusion 6 mg/hr (08/13/18 1102)    Principal Problem:   Acute on chronic respiratory failure with hypoxia (HCC) Active Problems:   Acute CHF (congestive heart failure) (HCC)   CKD (chronic kidney disease) stage 3, GFR 30-59 ml/min (HCC)   Lupus (HCC)   Hypothyroidism   Cor pulmonale (HCC)   LOS: 4 days

## 2018-08-15 LAB — CBC WITH DIFFERENTIAL/PLATELET
Abs Immature Granulocytes: 0.05 10*3/uL (ref 0.00–0.07)
Basophils Absolute: 0 10*3/uL (ref 0.0–0.1)
Basophils Relative: 1 %
EOS ABS: 0.3 10*3/uL (ref 0.0–0.5)
Eosinophils Relative: 3 %
HCT: 25 % — ABNORMAL LOW (ref 36.0–46.0)
Hemoglobin: 7.4 g/dL — ABNORMAL LOW (ref 12.0–15.0)
Immature Granulocytes: 1 %
LYMPHS ABS: 0.6 10*3/uL — AB (ref 0.7–4.0)
Lymphocytes Relative: 7 %
MCH: 27 pg (ref 26.0–34.0)
MCHC: 29.6 g/dL — ABNORMAL LOW (ref 30.0–36.0)
MCV: 91.2 fL (ref 80.0–100.0)
Monocytes Absolute: 0.7 10*3/uL (ref 0.1–1.0)
Monocytes Relative: 9 %
Neutro Abs: 6.4 10*3/uL (ref 1.7–7.7)
Neutrophils Relative %: 79 %
Platelets: 299 10*3/uL (ref 150–400)
RBC: 2.74 MIL/uL — ABNORMAL LOW (ref 3.87–5.11)
RDW: 15.9 % — ABNORMAL HIGH (ref 11.5–15.5)
WBC: 8 10*3/uL (ref 4.0–10.5)
nRBC: 0 % (ref 0.0–0.2)

## 2018-08-15 LAB — BASIC METABOLIC PANEL
Anion gap: 13 (ref 5–15)
BUN: 52 mg/dL — ABNORMAL HIGH (ref 6–20)
CO2: 26 mmol/L (ref 22–32)
Calcium: 9 mg/dL (ref 8.9–10.3)
Chloride: 95 mmol/L — ABNORMAL LOW (ref 98–111)
Creatinine, Ser: 1.43 mg/dL — ABNORMAL HIGH (ref 0.44–1.00)
GFR calc non Af Amer: 43 mL/min — ABNORMAL LOW (ref >60)
GFR, EST AFRICAN AMERICAN: 50 mL/min — AB (ref >60)
Glucose, Bld: 106 mg/dL — ABNORMAL HIGH (ref 70–99)
Potassium: 3.5 mmol/L (ref 3.5–5.1)
Sodium: 134 mmol/L — ABNORMAL LOW (ref 135–145)

## 2018-08-15 NOTE — Progress Notes (Signed)
Physical Therapy Treatment Patient Details Name: Christine Khan MRN: [id number] DOB: 1970/08/27 Today's Date: 08/15/2018    History of Present Illness Christine Khan is a 48 y.o. 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    PT Comments    Assisted OOB to [facility] then to recliner.  General transfer comment: increased time pt was self able using rail and arm rest.  Pt tolertaed weight shifting towrads R LE and static standing x 1 min.  Pt c/o R LE pain stated "it starts at my toes and goes up to my knee".  Pt was able to weight shift and tolerate full WB during session.  Encourage increase OOB activity.   Follow Up Recommendations  Home health PT;Supervision/Assistance - 24 hour;Other (comment)(HH Aide)     Equipment Recommendations  None recommended by PT    Recommendations for Other Services       Precautions / Restrictions Precautions Precautions: Fall Precaution Comments: R LE pain Restrictions Weight Bearing Restrictions: No Other Position/Activity Restrictions: WBAT    Mobility  Bed Mobility Overal bed mobility: Needs Assistance Bed Mobility: Supine to Sit     Supine to sit: Supervision     General bed mobility comments: pt self able with increased time   Transfers Overall transfer level: Needs assistance Equipment used: None Transfers: Sit to/from Stand;Stand Pivot Transfers Sit to Stand: Supervision Stand pivot transfers: Supervision       General transfer comment: increased time pt was self able using rail and arm rest.  Pt tolertaed weight shifting towrads R LE and static standing x 1 min   Ambulation/Gait             General Gait Details: pre gait transfers and static standing   Stairs             Wheelchair Mobility    Modified Rankin (Stroke Patients Only)       Balance                                             Cognition Arousal/Alertness: Awake/alert Behavior During Therapy: WFL for tasks assessed/performed Overall Cognitive Status: Within Functional Limits for tasks assessed                                        Exercises      General Comments        Pertinent Vitals/Pain Pain Assessment: Faces Faces Pain Scale: Hurts little more Pain Location: R LE "from toes up to knee" Pain Descriptors / Indicators: Grimacing;Burning;Shooting Pain Intervention(s): Monitored during session;Repositioned    Home Living                      Prior Function            PT Goals (current goals can now be found in the care plan section) Progress towards PT goals: Progressing toward goals    Frequency    Min 3X/week      PT Plan Current plan remains appropriate    Co-evaluation              AM-PAC PT "6 Clicks" Mobility   Outcome Measure  Help needed turning from  your back to your side while in a flat bed without using bedrails?: A Little Help needed moving from lying on your back to sitting on the side of a flat bed without using bedrails?: A Little Help needed moving to and from a bed to a chair (including a wheelchair)?: A Little Help needed standing up from a chair using your arms (e.g., wheelchair or bedside chair)?: A Little Help needed to walk in hospital room?: A Little Help needed climbing 3-5 steps with a railing? : A Little 6 Click Score: 18    End of Session Equipment Utilized During Treatment: Gait belt;Oxygen Activity Tolerance: Patient tolerated treatment well Patient left: in chair;with family/visitor present;with call bell/phone within reach Nurse Communication: Mobility status PT Visit Diagnosis: Unsteadiness on feet (R26.81);Muscle weakness (generalized) (M62.81)     Time: 5726-2035 PT Time Calculation (min) (ACUTE ONLY): 30 min  Charges:  $Therapeutic Activity: 23-37 mins                     Rica Koyanagi   PTA Acute  Rehabilitation Services Pager      [phone] Office      [phone]

## 2018-08-15 NOTE — Progress Notes (Signed)
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 Jerilynn Mages. [facility]) on CellCept and Plaquenil = cor pulmonale Last echo 12/2017 normal EF grade 1 diastolic dysfunction severely dilated RV/RA severe TR severe pulmonary hypertension Baseline weight 211 Chronic respiratory failure on 3 to 6 L of oxygen OHSS Sickle cell trait Hypothyroid Hyperlipidemia Chronic pain on Nucynta Hypothyroid Severe ILD CKD 3 with baseline creatinine 1.6-felt to be secondary to cardiorenal syndrome with cor pulmonale (followed by Dr. Gerarda Gunther) Chronic anemia  Previously admitted 02/2018, 04/2018 acute respiratory failure-at that time diuresed had a VQ scan low probability PE  Came to emergency room at [facility] long 1/17 shortness of breath imaging showed chronic interstitial lung disease cardiomegaly BNP 1000 troponins negative EKG diffuse changes with overall diffuse weakness hemoglobin in the 8 range Patient was started on diuretics and is actively being diuresed  A & Plan Acute hypoxic respiratory failure superimposed secondary to heart failure predominantly in the setting of ILD on chronic respiratory failure Shortness of breath is seemingly improved increased Lasix gtt 26m/hr-->8/hr on 1/21 and expect further diuresis  New onset epistaxis overnight 1/19 Nonsevere-hemoglobin in 7.4 range-threshold to Tx is 7.0 History of lupus and crest syndrome Continue Plaquenil 400 twice daily, CellCept 7.5 mils twice daily, prednisone 7.5 daily Will need follow-up as an outpatient with her rheumatologist Dr. PPosey ProntoCor pulmonale-baseline weight 211  Diuretic dosing augmented as above needs to be closer to discharge weight Constitutional hypotension Patient is preload dependent has low blood pressure at baseline-at some point may need further  input from cardiology at this juncture diuresing well without significant drop in pressure hypokalemia secondary to diuresis Resolved-continue replacement, magnesium good CKD 3 Baseline creatinine 1.3-1.5 and remains within that range-continue diuresis Hypothyroid Continue Synthroid 37 mcg daily will need recheck TSH 1 month Chronic pain on Nucynta Continue above meds, Neurontin 900 twice daily History of ILD  likely secondary to rheumatological process-continue Revatio 20 3 times daily, treprostinil 18 mcg 4 times daily inhalations OHSS-not a candidate for CPAP mask does not use? 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 Baseline hemoglobin 8 range worsened by epistaxis however still stable not meeting transfusion threshold Has resolved History of gout Continue colchicine probenecid combination tablet, Uloric 40 daily Chronically elevated d-dimer No concerns at this time that this is PE BMI 39 Insomnia Continue Rozerem 8 at bedtime, trazodone 50 at bedtime as needed  DVT SCD code Status: Full code communication: Cussed with mother at the bedside r disposition Plan: Expect diuresis with IV Lasix and continued improvement she is not ready for discharge will not need for a while   Ilija Maxim, MD  [facility] [email or web address] 7PM-7AM contact night coverage as above 08/15/2018, 11:56 AM  LOS: 5 days   Consultants:  None  Procedures:  No  Antimicrobials:  No  Interval history/Subjective:  Epistaxis is completely resolved patient is sitting in bed more conversant and coherent although still feels pretty swollen  Objective:  Vitals:  Vitals:   08/15/18 0841 08/15/18 0843  BP:    Pulse:    Resp:    Temp:    SpO2: 98% 98%    Exam:  Cushingoid, sleepy eating some No icterus no pallor on nasal cannula oxygen S1-S2 audible no murmur rub or gallop Chest is clear without added rales rhonchi or  adventitious sounds Anasarca with  lower extremity edema extending partially all the way up to lower abdomen    I have personally reviewed the following:  DATA   Labs:  BUN/creatinine 55/1.4-->52/1.3->52/1.4  , K 2.9-->now 3.5,   Hemoglobin 8.1 dropped to 7.7-->7.4  I/oh -4.3  basline weight 211 [95 k], today still up 219   Scheduled Meds: . budesonide (PULMICORT) nebulizer solution  0.25 mg Nebulization BID  . calcium carbonate  1 tablet Oral BID  . colchicine  0.6 mg Oral Daily  . febuxostat  40 mg Oral Daily  . gabapentin  900 mg Oral BID  . hydroxychloroquine  200 mg Oral BID  . iron polysaccharides  150 mg Oral Daily  . levothyroxine  37.5 mcg Oral Q0600  . loratadine  10 mg Oral Daily  . metolazone  5 mg Oral QODAY  . montelukast  10 mg Oral Daily  . mycophenolate  1,500 mg Oral BID  . potassium chloride  40 mEq Oral BID  . predniSONE  7.5 mg Oral Daily  . probenecid  500 mg Oral Daily  . sildenafil  20 mg Oral TID  . simvastatin  20 mg Oral Daily  . tacrolimus  1 application Topical BID  . Treprostinil  18 mcg Inhalation QID   Continuous Infusions: . furosemide (LASIX) infusion 8 mg/hr (08/15/18 1012)    Principal Problem:   Acute on chronic respiratory failure with hypoxia (HCC) Active Problems:   Acute CHF (congestive heart failure) (HCC)   CKD (chronic kidney disease) stage 3, GFR 30-59 ml/min (HCC)   Lupus (HCC)   Hypothyroidism   Cor pulmonale (HCC)   LOS: 5 days

## 2018-08-15 NOTE — Evaluation (Signed)
Occupational Therapy Evaluation Patient Details Name: Christine Khan MRN: [id number] DOB: 1970-09-03 Today's Date: 08/15/2018    History of Present Illness Christine Khan is a 48 y.o. 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   Clinical Impression   Pt was admitted for the above.  Her mother is her caregiver. Pt has relatively new RLE weakness (x 1 month or so).  Her abilities vary according to how she is feeling.  Will try a leg lifter to see if she can help position her leg in bed. And will also focus on toilet transfers    Follow Up Recommendations    24/7 supervision   Equipment Recommendations  None recommended by OT    Recommendations for Other Services       Precautions / Restrictions Precautions Precautions: Fall Restrictions Weight Bearing Restrictions: No      Mobility Bed Mobility         Supine to sit: Min guard Sit to supine: Mod assist   General bed mobility comments: for both legs.  She needs a lot of assistance for bil LEs and has UE weakness. Doubt leg lifter would make her independent  Transfers   Equipment used: 1 person hand held assist     Stand pivot transfers: Min assist       General transfer comment: steadying assistance    Balance     Sitting balance-Leahy Scale: Fair       Standing balance-Leahy Scale: Poor(one hand on bedrail)                             ADL either performed or assessed with clinical judgement   ADL Overall ADL's : Needs assistance/impaired Eating/Feeding: Independent   Grooming: Set up   Upper Body Bathing: Set up   Lower Body Bathing: Moderate assistance   Upper Body Dressing : Minimal assistance   Lower Body Dressing: Maximal assistance   Toilet Transfer: Minimal assistance;BSC;Stand-pivot   Toileting- Clothing Manipulation and Hygiene: Min guard;Sit to/from  stand Toileting - Clothing Manipulation Details (indicate cue type and reason): stood greater than 5 min for this       General ADL Comments: pt is near baseline for adls.  She has had more difficulty since RLE got weaker. Pt is good about initiating rest breaks     Vision         Perception     Praxis      Pertinent Vitals/Pain Faces Pain Scale: Hurts worst(end of session) Pain Location: "all over" Pain Intervention(s): Limited activity within patient's tolerance;Monitored during session;Repositioned     Hand Dominance     Extremity/Trunk Assessment Upper Extremity Assessment Upper Extremity Assessment: Generalized weakness(grossly 3/5)           Communication Communication Communication: No difficulties   Cognition Arousal/Alertness: Awake/alert Behavior During Therapy: WFL for tasks assessed/performed Overall Cognitive Status: Within Functional Limits for tasks assessed                                     General Comments       Exercises     Shoulder Instructions      Home Living Family/patient expects to be discharged to:: Private residence Living Arrangements: Parent Available Help at Discharge: Family;Available 24 hours/day  Bathroom Shower/Tub: Teacher, early years/pre: Standard     Home Equipment: Environmental consultant - 4 wheels;Wheelchair - power;Bedside commode;Hospital bed   Additional Comments: has a tub bench, but unable to use.  Has a reacher      Prior Functioning/Environment    Gait / Transfers Assistance Needed: on good days she transfers to electric w/c herself, on bad days, her mother A her. ADL's / Homemaking Assistance Needed: mother assists her some with LB dressing, takes sponge baths            OT Problem List: Decreased strength;Decreased activity tolerance;Impaired balance (sitting and/or standing);Pain;Decreased knowledge of use of DME or AE      OT Treatment/Interventions:      OT  Goals(Current goals can be found in the care plan section) Acute Rehab OT Goals Patient Stated Goal: get some sleep OT Goal Formulation: With patient Time For Goal Achievement: 08/29/18 Potential to Achieve Goals: Good ADL Goals Pt Will Transfer to Toilet: with supervision;bedside commode;stand pivot transfer Additional ADL Goal #1: pt will repositon RLE in bed with leg lifter without assistance  OT Frequency:     Barriers to D/C:            Co-evaluation              AM-PAC OT "6 Clicks" Daily Activity     Outcome Measure Help from another person eating meals?: None Help from another person taking care of personal grooming?: A Little Help from another person toileting, which includes using toliet, bedpan, or urinal?: Total Help from another person bathing (including washing, rinsing, drying)?: A Lot Help from another person to put on and taking off regular upper body clothing?: A Little Help from another person to put on and taking off regular lower body clothing?: A Lot 6 Click Score: 15   End of Session    Activity Tolerance: Patient tolerated treatment well Patient left: in bed;with call bell/phone within reach  OT Visit Diagnosis: Muscle weakness (generalized) (M62.81)                Time: 8182-9937 OT Time Calculation (min): 54 min Charges:  OT General Charges $OT Visit: 1 Visit OT Evaluation $OT Eval Low Complexity: 1 Low OT Treatments $Self Care/Home Management : 38-52 mins  Lesle Chris, OTR/L Acute Rehabilitation Services [phone] [facility] pager [phone] office 08/15/2018  [place] 08/15/2018, 12:49 PM

## 2018-08-16 LAB — PREPARE RBC (CROSSMATCH)

## 2018-08-16 LAB — RENAL FUNCTION PANEL
Albumin: 4 g/dL (ref 3.5–5.0)
Anion gap: 14 (ref 5–15)
BUN: 48 mg/dL — ABNORMAL HIGH (ref 6–20)
CO2: 26 mmol/L (ref 22–32)
Calcium: 9.2 mg/dL (ref 8.9–10.3)
Chloride: 96 mmol/L — ABNORMAL LOW (ref 98–111)
Creatinine, Ser: 1.35 mg/dL — ABNORMAL HIGH (ref 0.44–1.00)
GFR calc Af Amer: 54 mL/min — ABNORMAL LOW (ref >60)
GFR, EST NON AFRICAN AMERICAN: 46 mL/min — AB (ref >60)
Glucose, Bld: 103 mg/dL — ABNORMAL HIGH (ref 70–99)
PHOSPHORUS: 3.5 mg/dL (ref 2.5–4.6)
Potassium: 3.7 mmol/L (ref 3.5–5.1)
Sodium: 136 mmol/L (ref 135–145)

## 2018-08-16 LAB — HEMOGLOBIN AND HEMATOCRIT, BLOOD
HCT: 22.9 % — ABNORMAL LOW (ref 36.0–46.0)
HCT: 28.6 % — ABNORMAL LOW (ref 36.0–46.0)
Hemoglobin: 6.7 g/dL — CL (ref 12.0–15.0)
Hemoglobin: 8.6 g/dL — ABNORMAL LOW (ref 12.0–15.0)

## 2018-08-16 LAB — ABO/RH: ABO/RH(D): AB POS

## 2018-08-16 LAB — MAGNESIUM: Magnesium: 2.2 mg/dL (ref 1.7–2.4)

## 2018-08-16 MED ORDER — SODIUM CHLORIDE 0.9% IV SOLUTION
Freq: Once | INTRAVENOUS | Status: AC
  Administered 2018-08-16: 11:00:00 via INTRAVENOUS

## 2018-08-16 NOTE — Care Management Note (Addendum)
Case Management Note  Patient Details  Name: Christine Khan MRN: [id number] Date of Birth: [birth year]  Subjective/Objective:                    Action/Plan:Pt states that she was with [place]. Will continue to follow.   Expected Discharge Date:  (unknown)               Expected Discharge Plan:  [place]  In-House Referral:     Discharge planning Services  CM Consult  Post Acute Care Choice:    Choice offered to:  Patient  DME Arranged:    DME Agency:     HH Arranged:  PT,OT, NA [place] Agency:  [place]  Status of Service:  In process, will continue to follow  If discussed at Long Length of Stay Meetings, dates discussed:    Additional CommentsPurcell Mouton, RN 08/16/2018, 3:33 PM

## 2018-08-16 NOTE — Progress Notes (Signed)
PROGRESS NOTE  Ridley Dileo YBW:[id number] DOB: 08-Oct-1970 DOA: 08/10/2018 PCP: [name], NP   LOS: 6 days    Brief Narrative / Interim history: 48 year old female with history of chronic respiratory failure on 3 to 6 L by [place], PHTN 2/2 CREST syndrome, lupus, severe ILD (followed by Dr. Jerilynn Mages. [facility]) on CellCept, Plaquenil and prednisone, severe ILD, dCHF, OHS, sickle cell trait, hypothyroid, chronic pain on Nucynta, CKD 3 chronic anemia admitted with acute on chronic hypoxic respiratory failure.   Subjective: Complains about right leg swelling.  Breathing somewhat better patient denies chest pain.  Had bowel movement yesterday.  Denies melena or hematochezia.  States that she is on iron.  Assessment & Plan: Principal Problem:   Acute on chronic respiratory failure with hypoxia (HCC) Active Problems:   Acute CHF (congestive heart failure) (HCC)   CKD (chronic kidney disease) stage 3, GFR 30-59 ml/min (HCC)   Lupus (HCC)   Hypothyroidism   Cor pulmonale (HCC)  Acute on chronic hypoxemic respiratory failure: Multifactorial primarily diastolic CHF exacerbation.  Also underlying severe interstitial lung disease and OHS.  Fair response to IV Lasix drip.  Still with significant lower extremity edema, anasarca and crackles. -Continue Lasix drip -Daily weight, intake output and renal function. -We will maintain Foley.  History of lupus/CREST syndrome/severe interstitial lung disease -Continue Plaquenil, CellCept and prednisone. -Outpatient follow-up with her rheumatologist  Anasarca -IV Lasix as above  Normocytic anemia of chronic disease: Hemoglobin down to 6.7 this morning -Transfuse 1 unit -Posttransfusion H&H  CKD 3: Stable -Monitor renal function  Hypothyroidism/history of GERD/hyperlipidemia/insomnia -Continue home meds  Chronic pain on Nucynta and Zanaflex -Continue home meds  Morbid obesity: Difficult case given her comorbidities and chronic  steroid -Outpatient follow-up Scheduled Meds: . budesonide (PULMICORT) nebulizer solution  0.25 mg Nebulization BID  . calcium carbonate  1 tablet Oral BID  . colchicine  0.6 mg Oral Daily  . febuxostat  40 mg Oral Daily  . gabapentin  900 mg Oral BID  . hydroxychloroquine  200 mg Oral BID  . iron polysaccharides  150 mg Oral Daily  . levothyroxine  37.5 mcg Oral Q0600  . loratadine  10 mg Oral Daily  . metolazone  5 mg Oral QODAY  . montelukast  10 mg Oral Daily  . mycophenolate  1,500 mg Oral BID  . potassium chloride  40 mEq Oral BID  . predniSONE  7.5 mg Oral Daily  . probenecid  500 mg Oral Daily  . sildenafil  20 mg Oral TID  . simvastatin  20 mg Oral Daily  . tacrolimus  1 application Topical BID  . Treprostinil  18 mcg Inhalation QID   Continuous Infusions: . furosemide (LASIX) infusion 8 mg/hr (08/16/18 1030)   PRN Meds:.acetaminophen **OR** acetaminophen, albuterol, alum & mag hydroxide-simeth, lidocaine, ondansetron **OR** ondansetron (ZOFRAN) IV, ramelteon, tapentadol, tiZANidine, traZODone  DVT prophylaxis: SCD  code Status: Full code Family Communication: Mother at bedside Disposition Plan: Remains inpatient.  Continues to have significant fluid overload.  Continue IV Lasix drip.  Consultants:   None  Procedures:   None  Antimicrobials:  None  Objective: Vitals:   08/16/18 0600 08/16/18 0619 08/16/18 1423 08/16/18 1454  BP: 97/60  93/68 (!) 93/58  Pulse: 89  67 65  Resp: 17   18  Temp: 97.9 F (36.6 C)  98.1 F (36.7 C) 98.3 F (36.8 C)  TempSrc: Oral  Oral Oral  SpO2: 98%  93% 100%  Weight:  99.3 kg  Height:        Intake/Output Summary (Last 24 hours) at 08/16/2018 1547 Last data filed at 08/16/2018 1516 Gross per 24 hour  Intake 790.01 ml  Output 1500 ml  Net -709.99 ml   Filed Weights   08/15/18 0500 08/15/18 0530 08/16/18 0619  Weight: 99.4 kg 99.4 kg 99.3 kg    Examination:  GENERAL: Appears well. No acute distress.   HEENT: MMM.  Vision and Hearing grossly intact.  NECK: Supple.  No JVD.  LUNGS: On 2 L by [place].  no IWOB. Good air movement.  Crackles bilaterally HEART:  RRR. Heart sounds normal.  ABD: Bowel sounds present. Soft. Non tender.  Anasarca MSK/EXT: Bilateral pitting edema and anasarca SKIN: no apparent skin lesion.  NEURO: Awake, alert and oriented appropriately.  No gross deficit.  PSYCH: Calm. Normal affect.   Data Reviewed: I have independently reviewed following labs and imaging studies   CBC: Recent Labs  Lab 08/10/18 2055 08/11/18 0407 08/13/18 0424 08/14/18 1004 08/15/18 0423 08/16/18 0855  WBC 5.5 5.7 6.7 7.0 8.0  --   NEUTROABS  --   --  5.0  --  6.4  --   HGB 8.6* 8.4* 8.1* 7.7* 7.4* 6.7*  HCT 28.7* 28.4* 27.4* 26.5* 25.0* 22.9*  MCV 92.0 91.6 91.3 93.6 91.2  --   PLT 302 268 310 277 299  --    Basic Metabolic Panel: Recent Labs  Lab 08/11/18 0407  08/12/18 0358 08/13/18 0424 08/14/18 0412 08/15/18 0423 08/16/18 0353  NA 133*   < > 134* 133* 134* 134* 136  K 2.8*   < > 3.4* 2.9* 3.6 3.5 3.7  CL 93*   < > 93* 92* 94* 95* 96*  CO2 26   < > _0 GLUCOSE 116*   < > 103* 95 108* 106* 103*  BUN 55*   < > 55* 55* 52* 52* 48*  CREATININE 1.43*   < > 1.46* 1.43* 1.39* 1.43* 1.35*  CALCIUM 8.9   < > 9.1 9.3 9.2 9.0 9.2  MG 2.1  --  2.0 2.1 2.0  --  2.2  PHOS  --   --   --   --  3.2  --  3.5   < > = values in this interval not displayed.   GFR: Estimated Creatinine Clearance: 57.3 mL/min (A) (by C-G formula based on SCr of 1.35 mg/dL (H)). Liver Function Tests: Recent Labs  Lab 08/10/18 2354 08/14/18 0412 08/16/18 0353  AST 20  --   --   ALT 8  --   --   ALKPHOS 56  --   --   BILITOT 0.7  --   --   PROT 7.5  --   --   ALBUMIN 4.7 4.1 4.0   No results for input(s): LIPASE, AMYLASE in the last 168 hours. No results for input(s): AMMONIA in the last 168 hours. Coagulation Profile: No results for input(s): INR, PROTIME in the last 168  hours. Cardiac Enzymes: Recent Labs  Lab 08/11/18 0407 08/11/18 1028 08/11/18 1530  TROPONINI 0.06* 0.05* 0.05*   BNP (last 3 results) No results for input(s): PROBNP in the last 8760 hours. HbA1C: No results for input(s): HGBA1C in the last 72 hours. CBG: No results for input(s): GLUCAP in the last 168 hours. Lipid Profile: No results for input(s): CHOL, HDL, LDLCALC, TRIG, CHOLHDL, LDLDIRECT in the last 72 hours. Thyroid Function Tests: No results for input(s): TSH, T4TOTAL, FREET4,  T3FREE, THYROIDAB in the last 72 hours. Anemia Panel: No results for input(s): VITAMINB12, FOLATE, FERRITIN, TIBC, IRON, RETICCTPCT in the last 72 hours. Urine analysis:    Component Value Date/Time   COLORURINE YELLOW 02/23/2015 2313   APPEARANCEUR CLEAR 02/23/2015 2313   LABSPEC 1.014 02/23/2015 2313   PHURINE 6.0 02/23/2015 2313   GLUCOSEU NEGATIVE 02/23/2015 2313   HGBUR NEGATIVE 02/23/2015 2313   BILIRUBINUR NEGATIVE 02/23/2015 2313   KETONESUR NEGATIVE 02/23/2015 2313   PROTEINUR NEGATIVE 02/23/2015 2313   UROBILINOGEN 0.2 02/23/2015 2313   NITRITE NEGATIVE 02/23/2015 2313   LEUKOCYTESUR SMALL (A) 02/23/2015 2313   Sepsis Labs: Invalid input(s): PROCALCITONIN, LACTICIDVEN  No results found for this or any previous visit (from the past 240 hour(s)).    Radiology Studies: No results found.  [facility] Pager [phone]  If 7PM-7AM, please contact night-coverage [email or web address] Password [facility] 08/16/2018, 3:47 PM

## 2018-08-16 NOTE — Progress Notes (Signed)
CRITICAL VALUE ALERT  Critical Value:  hgb 6.7  Date & Time Notied:  08/16/18 _0   Provider Notified: Dr Cyndia Skeeters paged via amion  Orders Received/Actions taken:

## 2018-08-17 DIAGNOSIS — M329 Systemic lupus erythematosus, unspecified: Secondary | ICD-10-CM

## 2018-08-17 DIAGNOSIS — E039 Hypothyroidism, unspecified: Secondary | ICD-10-CM

## 2018-08-17 DIAGNOSIS — I5031 Acute diastolic (congestive) heart failure: Secondary | ICD-10-CM

## 2018-08-17 LAB — BPAM RBC
BLOOD PRODUCT EXPIRATION DATE: 202002122359
ISSUE DATE / TIME: 202001221432
Unit Type and Rh: 6200

## 2018-08-17 LAB — BASIC METABOLIC PANEL
Anion gap: 13 (ref 5–15)
BUN: 47 mg/dL — ABNORMAL HIGH (ref 6–20)
CO2: 26 mmol/L (ref 22–32)
Calcium: 9.3 mg/dL (ref 8.9–10.3)
Chloride: 97 mmol/L — ABNORMAL LOW (ref 98–111)
Creatinine, Ser: 1.3 mg/dL — ABNORMAL HIGH (ref 0.44–1.00)
GFR calc non Af Amer: 48 mL/min — ABNORMAL LOW (ref >60)
GFR, EST AFRICAN AMERICAN: 56 mL/min — AB (ref >60)
Glucose, Bld: 84 mg/dL (ref 70–99)
Potassium: 3.7 mmol/L (ref 3.5–5.1)
SODIUM: 136 mmol/L (ref 135–145)

## 2018-08-17 LAB — TYPE AND SCREEN
ABO/RH(D): AB POS
Antibody Screen: NEGATIVE
Unit division: 0

## 2018-08-17 LAB — MAGNESIUM: MAGNESIUM: 2.2 mg/dL (ref 1.7–2.4)

## 2018-08-17 LAB — HEMOGLOBIN AND HEMATOCRIT, BLOOD
HEMATOCRIT: 29.7 % — AB (ref 36.0–46.0)
HEMOGLOBIN: 8.8 g/dL — AB (ref 12.0–15.0)

## 2018-08-17 MED ORDER — SODIUM CHLORIDE 0.9 % IV SOLN
INTRAVENOUS | Status: DC | PRN
  Administered 2018-08-17: 250 mL via INTRAVENOUS

## 2018-08-17 NOTE — Progress Notes (Signed)
RT inquired (with PT) about Tyvasco - states she had her first dose of the day around [phone] (not time for next dosage at this time). PT states she will take again in near future. RN aware and agrees to scan this home med when PT is ready to administer.

## 2018-08-17 NOTE — Progress Notes (Signed)
Physical Therapy Treatment Patient Details Name: Christine Khan MRN: [id number] DOB: 1971/03/25 Today's Date: 08/17/2018    History of Present Illness Anneke Coxe is a 48 y.o. 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    PT Comments    Pt in bed on 4 lts (home O2).  Assisted OOB with increased time to amb a "room" distance + 2 assist for safety such that recliner was following.  Pt present with decreased WBing and decreased swing phase of R LE.  Pt stated "I can't feel my leg".  R LE did support pt's body weight, no knee or hip buckle.  Positioned in recliner upright with B LE elevated.  Pt in room with mom.    Follow Up Recommendations  Home health PT;Supervision/Assistance - 24 hour;Other (comment)(HH Aide)     Equipment Recommendations  None recommended by PT    Recommendations for Other Services       Precautions / Restrictions Precautions Precautions: Fall Precaution Comments: R LE pain Restrictions Weight Bearing Restrictions: No Other Position/Activity Restrictions: WBAT    Mobility  Bed Mobility Overal bed mobility: Needs Assistance Bed Mobility: Supine to Sit     Supine to sit: Supervision Sit to supine: Min assist;Mod assist(with leg lifter)   General bed mobility comments: pt able self with increased time   Transfers Overall transfer level: Needs assistance Equipment used: Rolling walker (2 wheeled) Transfers: Sit to/from Stand Sit to Stand: Supervision         General transfer comment: increased time and MAX encouragement.  Pt c/o "I can't feel my legs"  Ambulation/Gait Ambulation/Gait assistance: Supervision;Min guard Gait Distance (Feet): 12 Feet Assistive device: Rolling walker (2 wheeled) Gait Pattern/deviations: Step-through pattern;Decreased step length - left;Decreased step length - right;Shuffle     General Gait Details:  pt stated prior to admit she was able to amb "a room distance" but mostly uses her power wheelchair for mobility   Stairs             Wheelchair Mobility    Modified Rankin (Stroke Patients Only)       Balance                                            Cognition Arousal/Alertness: Awake/alert Behavior During Therapy: WFL for tasks assessed/performed Overall Cognitive Status: Within Functional Limits for tasks assessed                                 General Comments: requires MAX encouragement      Exercises Other Exercises Other Exercises: pt c/o tightness in shoulders. She is very tight in traps.  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) and stretch to end range    General Comments        Pertinent Vitals/Pain Faces Pain Scale: Hurts little more Pain Location: RLE Pain Descriptors / Indicators: Grimacing Pain Intervention(s): Limited activity within patient's tolerance;Monitored during session;Repositioned    Home Living                      Prior Function  PT Goals (current goals can now be found in the care plan section) Progress towards PT goals: Progressing toward goals    Frequency    Min 3X/week      PT Plan Current plan remains appropriate    Co-evaluation              AM-PAC PT "6 Clicks" Mobility   Outcome Measure  Help needed turning from your back to your side while in a flat bed without using bedrails?: A Little Help needed moving from lying on your back to sitting on the side of a flat bed without using bedrails?: A Little Help needed moving to and from a bed to a chair (including a wheelchair)?: A Little Help needed standing up from a chair using your arms (e.g., wheelchair or bedside chair)?: A Little Help needed to walk in hospital room?: A Little Help  needed climbing 3-5 steps with a railing? : A Lot 6 Click Score: 17    End of Session Equipment Utilized During Treatment: Gait belt Activity Tolerance: Patient tolerated treatment well Patient left: in chair;with family/visitor present;with call bell/phone within reach Nurse Communication: Mobility status PT Visit Diagnosis: Unsteadiness on feet (R26.81);Muscle weakness (generalized) (M62.81)     Time: 2633-3545 PT Time Calculation (min) (ACUTE ONLY): 26 min  Charges:  $Gait Training: 8-22 mins $Therapeutic Activity: 8-22 mins                     Rica Koyanagi  PTA Acute  Rehabilitation Services Pager      [phone] Office      [phone]

## 2018-08-17 NOTE — Progress Notes (Signed)
Occupational Therapy Treatment Patient Details Name: Christine Khan MRN: [id number] DOB: [birth year] Today's Date: 08/17/2018    History of present illness Areal Hada is a 48 y.o. 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   OT comments  Worked on UE/scapula exercises with stretch to end range, peri care, and use of leg lifter to help with back to bed   Follow Up Recommendations  Supervision/Assistance - 24 hour    Equipment Recommendations  None recommended by OT    Recommendations for Other Services      Precautions / Restrictions Precautions Precautions: Fall Precaution Comments: R LE pain       Mobility Bed Mobility         Supine to sit: Supervision Sit to supine: Min assist;Mod assist(with leg lifter)   General bed mobility comments: assist for legs  Transfers                      Balance                                           ADL either performed or assessed with clinical judgement   ADL                               Toileting- Clothing Manipulation and Hygiene: Min guard(washed peri/buttocks when she stood to sidestep)         General ADL Comments: Pt used leg lifter to assist with back to bed.  She is not independent with this but can help. She is able to slide legs over in bed herself     Vision       Perception     Praxis      Cognition Arousal/Alertness: Awake/alert Behavior During Therapy: WFL for tasks assessed/performed Overall Cognitive Status: Within Functional Limits for tasks assessed                                          Exercises Exercises: Other exercises Other Exercises Other Exercises: pt c/o tightness in shoulders. She is very tight in traps.  Gave her homework to work on scapula depression and retraction Other Exercises: worked on A/AAROM to  [employer], relaxing traps (as she recruits these) and stretch to end range   Shoulder Instructions       General Comments      Pertinent Vitals/ Pain       Faces Pain Scale: Hurts little more Pain Location: RLE Pain Descriptors / Indicators: Grimacing Pain Intervention(s): Limited activity within patient's tolerance;Monitored during session;Repositioned  Home Living                                          Prior Functioning/Environment              Frequency  Min 2X/week        Progress Toward Goals  OT Goals(current goals can now be found in the care plan section)  Progress towards OT goals: Progressing toward goals     Plan  Co-evaluation                 AM-PAC OT "6 Clicks" Daily Activity     Outcome Measure   Help from another person eating meals?: None Help from another person taking care of personal grooming?: A Little Help from another person toileting, which includes using toliet, bedpan, or urinal?: A Little Help from another person bathing (including washing, rinsing, drying)?: A Lot Help from another person to put on and taking off regular upper body clothing?: A Little Help from another person to put on and taking off regular lower body clothing?: A Lot 6 Click Score: 17    End of Session    OT Visit Diagnosis: Muscle weakness (generalized) (M62.81)   Activity Tolerance Patient tolerated treatment well   Patient Left in bed;with call bell/phone within reach   Nurse Communication          Time: 0017-4944 OT Time Calculation (min): 37 min  Charges: OT General Charges $OT Visit: 1 Visit OT Treatments $Self Care/Home Management : 8-22 mins $Therapeutic Exercise: 8-22 mins  Lesle Chris, OTR/L Acute Rehabilitation Services [phone] [facility] pager [phone] office 08/17/2018   [place] 08/17/2018, 2:13 PM

## 2018-08-17 NOTE — Progress Notes (Signed)
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 Jerilynn Mages. [facility]) on CellCept, Plaquenil and prednisone, severe ILD, dCHF, OHS, sickle cell trait, hypothyroid, chronic pain on Nucynta, CKD 3 chronic anemia admitted with acute on chronic hypoxic respiratory failure primarily due to CHF exacerbation/fluid overload.  Subjective: No major events overnight of this morning.  Reports good urine output and improvement in leg swelling and breathing..  Denies chest pain.  Main complaint is generalized body pain which is chronic for her.  Assessment & Plan: Principal Problem:   Acute on chronic respiratory failure with hypoxia (HCC) Active Problems:   Acute CHF (congestive heart failure) (HCC)   CKD (chronic kidney disease) stage 3, GFR 30-59 ml/min (HCC)   Lupus (HCC)   Hypothyroidism   Cor pulmonale (HCC)  Acute on chronic hypoxemic respiratory failure: Multifactorial primarily diastolic CHF exacerbation.  Also underlying severe interstitial lung disease and OHS.  Fair response to IV Lasix drip.   About 3 L urine output in the last 24 hours.  Still with significant edema, LE dependent, and bibasilar crackles.  -Continue Lasix drip-responding well.  Renal function stable. -Daily weight, intake output and renal function. -We will maintain Foley.  History of lupus/CREST syndrome/severe interstitial lung disease -Continue Plaquenil, CellCept and prednisone. -Outpatient follow-up with her rheumatologist after this hospitalization  Anasarca: Good urine output. -Continue IV Lasix as above  Normocytic anemia of chronic disease: Hemoglobin down to 6.7 and trended up to 8.6 after 1 unit -Continue monitoring  CKD 3: Stable -Monitor renal  function  Hypothyroidism/history of GERD/hyperlipidemia/insomnia -Continue home meds  Chronic pain on Nucynta and Zanaflex -Continue home meds -Continue home prednisone  Morbid obesity: Difficult case given fluid status, her comorbidities and chronic steroid -Outpatient follow-up -Manage fluid overload as above  Scheduled Meds: . budesonide (PULMICORT) nebulizer solution  0.25 mg Nebulization BID  . calcium carbonate  1 tablet Oral BID  . colchicine  0.6 mg Oral Daily  . febuxostat  40 mg Oral Daily  . gabapentin  900 mg Oral BID  . hydroxychloroquine  200 mg Oral BID  . iron polysaccharides  150 mg Oral Daily  . levothyroxine  37.5 mcg Oral Q0600  . loratadine  10 mg Oral Daily  . metolazone  5 mg Oral QODAY  . montelukast  10 mg Oral Daily  . mycophenolate  1,500 mg Oral BID  . potassium chloride  40 mEq Oral BID  . predniSONE  7.5 mg Oral Daily  . probenecid  500 mg Oral Daily  . sildenafil  20 mg Oral TID  . simvastatin  20 mg Oral Daily  . tacrolimus  1 application Topical BID  . Treprostinil  18 mcg Inhalation QID   Continuous Infusions: . sodium chloride    . furosemide (LASIX) infusion 8 mg/hr (08/17/18 0600)   PRN Meds:.sodium chloride, acetaminophen **OR** acetaminophen, albuterol, alum & mag hydroxide-simeth, lidocaine, ondansetron **OR** ondansetron (ZOFRAN) IV, ramelteon, tapentadol, tiZANidine, traZODone  DVT prophylaxis: SCD  code Status: Full code Family Communication: Mother at bedside Disposition Plan: Remains inpatient.  Continues to have significant fluid overload.  Continue IV Lasix drip.  Consultants:   None  Procedures:   None  Antimicrobials:  None  Objective: Vitals:   08/16/18 1940 08/16/18 2113  08/17/18 0535 08/17/18 0934  BP:  (!) 94/57 101/61   Pulse:  64 94   Resp:  (!) 21 16   Temp:  97.9 F (36.6 C) 98.6 F (37 C)   TempSrc:  Oral Oral   SpO2: 99% 100% 97% 94%  Weight:   98 kg   Height:        Intake/Output  Summary (Last 24 hours) at 08/17/2018 1220 Last data filed at 08/17/2018 1132 Gross per 24 hour  Intake 1263.82 ml  Output 2975 ml  Net -1711.18 ml   Filed Weights   08/15/18 0530 08/16/18 0619 08/17/18 0535  Weight: 99.4 kg 99.3 kg 98 kg    Examination: GENERAL: No acute distress.  HEENT: MMM.  Vision and Hearing grossly intact.  NECK: Supple.  No JVD.  LUNGS: On 2 L by .  No IWOB.  Fair air movement bilaterally.  Bibasilar crackles HEART:  RRR. Heart sounds normal.  ABD: Bowel sounds present. Soft. Non tender.  EXT: Bilateral pitting edema SKIN: no apparent skin lesion.  NEURO: Awake, alert and oriented appropriately.  No gross deficit.  PSYCH: Calm. Normal affect.   Data Reviewed: I have independently reviewed following labs and imaging studies   CBC: Recent Labs  Lab 08/10/18 2055 08/11/18 0407 08/13/18 0424 08/14/18 1004 08/15/18 0423 08/16/18 0855 08/16/18 2028 08/17/18 0500  WBC 5.5 5.7 6.7 7.0 8.0  --   --   --   NEUTROABS  --   --  5.0  --  6.4  --   --   --   HGB 8.6* 8.4* 8.1* 7.7* 7.4* 6.7* 8.6* 8.8*  HCT 28.7* 28.4* 27.4* 26.5* 25.0* 22.9* 28.6* 29.7*  MCV 92.0 91.6 91.3 93.6 91.2  --   --   --   PLT 302 268 310 277 299  --   --   --    Basic Metabolic Panel: Recent Labs  Lab 08/12/18 0358 08/13/18 0424 08/14/18 0412 08/15/18 0423 08/16/18 0353 08/17/18 0500  NA 134* 133* 134* 134* 136 136  K 3.4* 2.9* 3.6 3.5 3.7 3.7  CL 93* 92* 94* 95* 96* 97*  CO2 _0 GLUCOSE 103* 95 108* 106* 103* 84  BUN 55* 55* 52* 52* 48* 47*  CREATININE 1.46* 1.43* 1.39* 1.43* 1.35* 1.30*  CALCIUM 9.1 9.3 9.2 9.0 9.2 9.3  MG 2.0 2.1 2.0  --  2.2 2.2  PHOS  --   --  3.2  --  3.5  --    GFR: Estimated Creatinine Clearance: 59 mL/min (A) (by C-G formula based on SCr of 1.3 mg/dL (H)). Liver Function Tests: Recent Labs  Lab 08/10/18 2354 08/14/18 0412 08/16/18 0353  AST 20  --   --   ALT 8  --   --   ALKPHOS 56  --   --   BILITOT 0.7  --   --    PROT 7.5  --   --   ALBUMIN 4.7 4.1 4.0   No results for input(s): LIPASE, AMYLASE in the last 168 hours. No results for input(s): AMMONIA in the last 168 hours. Coagulation Profile: No results for input(s): INR, PROTIME in the last 168 hours. Cardiac Enzymes: Recent Labs  Lab 08/11/18 0407 08/11/18 1028 08/11/18 1530  TROPONINI 0.06* 0.05* 0.05*   BNP (last 3 results) No results for input(s): PROBNP in the last 8760 hours. HbA1C: No results for input(s): HGBA1C in the last 72 hours. CBG: No results for  input(s): GLUCAP in the last 168 hours. Lipid Profile: No results for input(s): CHOL, HDL, LDLCALC, TRIG, CHOLHDL, LDLDIRECT in the last 72 hours. Thyroid Function Tests: No results for input(s): TSH, T4TOTAL, FREET4, T3FREE, THYROIDAB in the last 72 hours. Anemia Panel: No results for input(s): VITAMINB12, FOLATE, FERRITIN, TIBC, IRON, RETICCTPCT in the last 72 hours. Urine analysis:    Component Value Date/Time   COLORURINE YELLOW 02/23/2015 2313   APPEARANCEUR CLEAR 02/23/2015 2313   LABSPEC 1.014 02/23/2015 2313   PHURINE 6.0 02/23/2015 2313   GLUCOSEU NEGATIVE 02/23/2015 2313   HGBUR NEGATIVE 02/23/2015 2313   BILIRUBINUR NEGATIVE 02/23/2015 2313   KETONESUR NEGATIVE 02/23/2015 2313   PROTEINUR NEGATIVE 02/23/2015 2313   UROBILINOGEN 0.2 02/23/2015 2313   NITRITE NEGATIVE 02/23/2015 2313   LEUKOCYTESUR SMALL (A) 02/23/2015 2313   Sepsis Labs: Invalid input(s): PROCALCITONIN, LACTICIDVEN  No results found for this or any previous visit (from the past 240 hour(s)).    Radiology Studies: No results found.  Rolin Schult T. [facility] Pager [phone]  If 7PM-7AM, please contact night-coverage [email or web address] Password [facility] 08/17/2018, 12:20 PM

## 2018-08-18 LAB — BASIC METABOLIC PANEL
Anion gap: 15 (ref 5–15)
BUN: 47 mg/dL — ABNORMAL HIGH (ref 6–20)
CALCIUM: 9.1 mg/dL (ref 8.9–10.3)
CO2: 26 mmol/L (ref 22–32)
Chloride: 95 mmol/L — ABNORMAL LOW (ref 98–111)
Creatinine, Ser: 1.42 mg/dL — ABNORMAL HIGH (ref 0.44–1.00)
GFR calc non Af Amer: 44 mL/min — ABNORMAL LOW (ref >60)
GFR, EST AFRICAN AMERICAN: 50 mL/min — AB (ref >60)
Glucose, Bld: 93 mg/dL (ref 70–99)
Potassium: 3.6 mmol/L (ref 3.5–5.1)
Sodium: 136 mmol/L (ref 135–145)

## 2018-08-18 LAB — HEMOGLOBIN AND HEMATOCRIT, BLOOD
HCT: 31.3 % — ABNORMAL LOW (ref 36.0–46.0)
Hemoglobin: 9 g/dL — ABNORMAL LOW (ref 12.0–15.0)

## 2018-08-18 LAB — MAGNESIUM: Magnesium: 2.3 mg/dL (ref 1.7–2.4)

## 2018-08-18 MED ORDER — TORSEMIDE 20 MG PO TABS
80.0000 mg | ORAL_TABLET | Freq: Two times a day (BID) | ORAL | Status: DC
  Administered 2018-08-18 – 2018-08-19 (×3): 80 mg via ORAL
  Filled 2018-08-18 (×4): qty 4

## 2018-08-18 NOTE — Progress Notes (Signed)
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 Jerilynn Mages. [facility]) on CellCept, Plaquenil and prednisone, severe ILD, dCHF, OHS, sickle cell trait, hypothyroid, chronic pain on Nucynta, CKD 3 chronic anemia admitted with acute on chronic hypoxic respiratory failure primarily due to CHF exacerbation/fluid overload.  She was started on IV Lasix drip 8 mg/h with good response.  Transition to torsemide 80 mg twice daily on 1/24.   Subjective: No major events overnight at this morning.  Reports good urine output.  Denies chest pain, dyspnea or abdominal pain.   Assessment & Plan: Principal Problem:   Acute on chronic respiratory failure with hypoxia (HCC) Active Problems:   Acute CHF (congestive heart failure) (HCC)   CKD (chronic kidney disease) stage 3, GFR 30-59 ml/min (HCC)   Lupus (HCC)   Hypothyroidism   Cor pulmonale (HCC)  Acute on chronic hypoxemic respiratory failure: Multifactorial primarily diastolic CHF exacerbation.  Also underlying severe interstitial lung disease and OHS.  Good response to IV Lasix drip but serum creatinine trended up slightly.  Still with some bibasilar crackles and dependent edema.  Still far from his dry weight. -Stop IV Lasix -Torsemide 80 mg twice daily -Continue metolazone 5 mg every other day -Fluid restriction -Daily weight, intake output and renal function. -We will maintain Foley.  History of lupus/CREST syndrome/severe interstitial lung disease -Continue Plaquenil, CellCept and prednisone. -Outpatient follow-up with her rheumatologist after this hospitalization  Anasarca: Good urine output. -Diuretics as above  Normocytic anemia of chronic disease: Hemoglobin down to 6.7 and trended up to 8.6  after 1 unit remained stable -Continue monitoring  CKD 3: creatinine slightly up trended -Monitor renal function -Adjusted diuretics as above  Hypothyroidism/history of GERD/hyperlipidemia/insomnia -Continue home meds  Chronic pain on Nucynta and Zanaflex -Continue home meds -Continue home prednisone  Morbid obesity: Difficult case given fluid status, her comorbidities and chronic steroid -Outpatient follow-up -Manage fluid overload as above  Scheduled Meds: . budesonide (PULMICORT) nebulizer solution  0.25 mg Nebulization BID  . calcium carbonate  1 tablet Oral BID  . colchicine  0.6 mg Oral Daily  . febuxostat  40 mg Oral Daily  . gabapentin  900 mg Oral BID  . hydroxychloroquine  200 mg Oral BID  . iron polysaccharides  150 mg Oral Daily  . levothyroxine  37.5 mcg Oral Q0600  . loratadine  10 mg Oral Daily  . metolazone  5 mg Oral QODAY  . montelukast  10 mg Oral Daily  . mycophenolate  1,500 mg Oral BID  . potassium chloride  40 mEq Oral BID  . predniSONE  7.5 mg Oral Daily  . probenecid  500 mg Oral Daily  . sildenafil  20 mg Oral TID  . simvastatin  20 mg Oral Daily  . tacrolimus  1 application Topical BID  . torsemide  80 mg Oral BID  . Treprostinil  18 mcg Inhalation QID   Continuous Infusions: . sodium chloride 10 mL/hr at 08/18/18 0600   PRN Meds:.sodium chloride, acetaminophen **OR** acetaminophen, albuterol, alum & mag hydroxide-simeth, lidocaine, ondansetron **OR** ondansetron (ZOFRAN) IV, ramelteon, tapentadol, tiZANidine, traZODone  DVT prophylaxis: SCD  code Status: Full code Family Communication: Mother at bedside Disposition Plan: Remains inpatient.  Still with some bibasilar crackles and significant  dependent edema.  Patient to torsemide.   Consultants:   None  Procedures:   None  Antimicrobials:  None  Objective: Vitals:   08/17/18 2006 08/17/18 2209 08/18/18 0605 08/18/18 0958  BP:  (!) 96/57 (!) 99/58   Pulse:  75 73   Resp:   (!) 21 18   Temp:  98.3 F (36.8 C) 99.5 F (37.5 C)   TempSrc:  Oral Oral   SpO2: 97% 97% 100% 95%  Weight:   98.4 kg   Height:        Intake/Output Summary (Last 24 hours) at 08/18/2018 1220 Last data filed at 08/18/2018 5852 Gross per 24 hour  Intake 628.56 ml  Output 2050 ml  Net -1421.44 ml   Filed Weights   08/16/18 0619 08/17/18 0535 08/18/18 0605  Weight: 99.3 kg 98 kg 98.4 kg    Examination: GENERAL: No acute distress.  HEENT: MMM.  Vision and Hearing grossly intact.  NECK: Supple.  No JVD.  LUNGS:  No IWOB.  Fair air movement bilaterally.  Bibasilar crackles HEART:  RRR. Heart sounds normal.  ABD: Bowel sounds present. Soft. Non tender.  EXT: Significant dependent edema NEURO: Awake, alert and oriented appropriately.  No gross deficit.  PSYCH: Calm. Normal affect. Data Reviewed: I have independently reviewed following labs and imaging studies   CBC: Recent Labs  Lab 08/13/18 0424 08/14/18 1004 08/15/18 0423 08/16/18 0855 08/16/18 2028 08/17/18 0500 08/18/18 0356  WBC 6.7 7.0 8.0  --   --   --   --   NEUTROABS 5.0  --  6.4  --   --   --   --   HGB 8.1* 7.7* 7.4* 6.7* 8.6* 8.8* 9.0*  HCT 27.4* 26.5* 25.0* 22.9* 28.6* 29.7* 31.3*  MCV 91.3 93.6 91.2  --   --   --   --   PLT 310 277 299  --   --   --   --    Basic Metabolic Panel: Recent Labs  Lab 08/13/18 0424 08/14/18 0412 08/15/18 0423 08/16/18 0353 08/17/18 0500 08/18/18 0356  NA 133* 134* 134* 136 136 136  K 2.9* 3.6 3.5 3.7 3.7 3.6  CL 92* 94* 95* 96* 97* 95*  CO2 _0 GLUCOSE 95 108* 106* 103* 84 93  BUN 55* 52* 52* 48* 47* 47*  CREATININE 1.43* 1.39* 1.43* 1.35* 1.30* 1.42*  CALCIUM 9.3 9.2 9.0 9.2 9.3 9.1  MG 2.1 2.0  --  2.2 2.2 2.3  PHOS  --  3.2  --  3.5  --   --    GFR: Estimated Creatinine Clearance: 54.2 mL/min (A) (by C-G formula based on SCr of 1.42 mg/dL (H)). Liver Function Tests: Recent Labs  Lab 08/14/18 0412 08/16/18 0353  ALBUMIN 4.1 4.0   No  results for input(s): LIPASE, AMYLASE in the last 168 hours. No results for input(s): AMMONIA in the last 168 hours. Coagulation Profile: No results for input(s): INR, PROTIME in the last 168 hours. Cardiac Enzymes: Recent Labs  Lab 08/11/18 1530  TROPONINI 0.05*   BNP (last 3 results) No results for input(s): PROBNP in the last 8760 hours. HbA1C: No results for input(s): HGBA1C in the last 72 hours. CBG: No results for input(s): GLUCAP in the last 168 hours. Lipid Profile: No results for input(s): CHOL, HDL, LDLCALC, TRIG, CHOLHDL, LDLDIRECT in the last 72 hours. Thyroid Function Tests: No results for input(s): TSH, T4TOTAL, FREET4, T3FREE, THYROIDAB in the last 72  hours. Anemia Panel: No results for input(s): VITAMINB12, FOLATE, FERRITIN, TIBC, IRON, RETICCTPCT in the last 72 hours. Urine analysis:    Component Value Date/Time   COLORURINE YELLOW 02/23/2015 2313   APPEARANCEUR CLEAR 02/23/2015 2313   LABSPEC 1.014 02/23/2015 2313   PHURINE 6.0 02/23/2015 2313   GLUCOSEU NEGATIVE 02/23/2015 2313   HGBUR NEGATIVE 02/23/2015 2313   BILIRUBINUR NEGATIVE 02/23/2015 2313   KETONESUR NEGATIVE 02/23/2015 2313   PROTEINUR NEGATIVE 02/23/2015 2313   UROBILINOGEN 0.2 02/23/2015 2313   NITRITE NEGATIVE 02/23/2015 2313   LEUKOCYTESUR SMALL (A) 02/23/2015 2313   Sepsis Labs: Invalid input(s): PROCALCITONIN, LACTICIDVEN  No results found for this or any previous visit (from the past 240 hour(s)).    Radiology Studies: No results found.  Zikeria Keough T. [facility] Pager [phone]  If 7PM-7AM, please contact night-coverage [email or web address] Password [email or web address] 08/18/2018, 12:20 PM

## 2018-08-19 DIAGNOSIS — N183 Chronic kidney disease, stage 3 (moderate): Secondary | ICD-10-CM

## 2018-08-19 LAB — BASIC METABOLIC PANEL
Anion gap: 12 (ref 5–15)
BUN: 47 mg/dL — ABNORMAL HIGH (ref 6–20)
CHLORIDE: 95 mmol/L — AB (ref 98–111)
CO2: 29 mmol/L (ref 22–32)
CREATININE: 1.34 mg/dL — AB (ref 0.44–1.00)
Calcium: 9.1 mg/dL (ref 8.9–10.3)
GFR calc non Af Amer: 47 mL/min — ABNORMAL LOW (ref >60)
GFR, EST AFRICAN AMERICAN: 54 mL/min — AB (ref >60)
Glucose, Bld: 94 mg/dL (ref 70–99)
Potassium: 3.4 mmol/L — ABNORMAL LOW (ref 3.5–5.1)
Sodium: 136 mmol/L (ref 135–145)

## 2018-08-19 LAB — HEMOGLOBIN AND HEMATOCRIT, BLOOD
HCT: 29.1 % — ABNORMAL LOW (ref 36.0–46.0)
Hemoglobin: 8.5 g/dL — ABNORMAL LOW (ref 12.0–15.0)

## 2018-08-19 LAB — MAGNESIUM: Magnesium: 2.2 mg/dL (ref 1.7–2.4)

## 2018-08-19 MED ORDER — TORSEMIDE 20 MG PO TABS: 100.0000 mg | ORAL_TABLET | Freq: Two times a day (BID) | ORAL | 0 refills | Status: AC

## 2018-08-19 MED ORDER — POTASSIUM CHLORIDE CRYS ER 20 MEQ PO TBCR
40.0000 meq | EXTENDED_RELEASE_TABLET | Freq: Once | ORAL | Status: AC
  Administered 2018-08-19: 40 meq via ORAL
  Filled 2018-08-19: qty 2

## 2018-08-19 NOTE — Care Management Note (Signed)
Case Management Note  Patient Details  Name: Christine Khan MRN: [id number] Date of Birth: 1971/05/29  Subjective/Objective:   CHF, acute on chronic resp failure with hypoxia                 Action/Plan: NCM spoke to pt and states she is active with [facility] for [facility]. CMS list placed on chart. She has neb machine at home. Contacted [facility] to make aware of dc home today with resumption of care.   PCP [name]    Expected Discharge Date:  08/19/18               Expected Discharge Plan:  [place]  In-House Referral:  NA  Discharge planning Services  CM Consult  Post Acute Care Choice:  Home Health, Resumption of Svcs/PTA Provider Choice offered to:  Patient  DME Arranged:  N/A DME Agency:  NA  HH Arranged:  RN, PT, OT, Nurse's Aide [place] Agency:  [facility]  Status of Service:  Completed, signed off  If discussed at [place] of Stay Meetings, dates discussed:    Additional Comments:  Erenest Rasher, RN 08/19/2018, 1:20 PM

## 2018-08-19 NOTE — Discharge Summary (Signed)
Physician Discharge Summary  Christine Khan NFA:[id number] DOB: 12-28-1970 DOA: 08/10/2018  PCP: [name], NP  Admit date: 08/10/2018 Discharge date: 08/19/2018  Admitted From: Home Disposition: Home with home health PT  Recommendations for Outpatient Follow-up:  1. Follow up with PCP in 1-2 weeks 2. Please obtain BMP/CBC in one week 3. Please follow up on the following pending results:  Home Health: Home health PT Equipment/Devices: None  Discharge Condition: Stable CODE STATUS: Full code  HPI: Per Dr. Virgina Norfolk Khan is a 48 y.o. 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  Patient states over the last 3 days patient has become more progressively short of breath than usual.  Has been having some pleuritic type of chest pain.  Denies any fever chills productive cough nausea vomiting or diarrhea.  Patient usually uses oxygen of around 3 to 6 L at home.  Patient states recently her Bactrim dose was decreased.  ED Course: In the ER patient was hypoxic and chest x-ray done showed features concerning for chronic interstitial lung disease and cardiomegaly.  BNP was about 1001.  Troponin initially was negative.  EKG shows diffuse T wave changes.  Patient has weakness on the lower extremity which patient states is chronic and he is being followed up.  Patient hemoglobin was around 8 and appears to be at baseline.  Patient did receive some blood transfusion 3 weeks ago.  Patient was given Lasix 80 mg IV in the ER and admitted for further management.  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 Jerilynn Mages. [facility]) on CellCept, Plaquenil and prednisone, severe ILD, dCHF, OHS, sickle cell trait, hypothyroid, chronic pain on Nucynta, CKD 3 chronic anemia admitted with acute on chronic hypoxic respiratory failure primarily due to CHF exacerbation/fluid overload.  She was started on IV Lasix drip 8 mg/h with good response metolazone 5 mg every other day.  Transitioned to torsemide 80 mg twice daily on 1/24 and continues to diurese well.  Weight trended down from 220 to 211 pounds which is about baseline. Discharged home on torsemide 100 mg twice daily with metolazone 5 mg as needed, which has been her regimen before she ran out of her torsemide and started Bumex prior to admission.  See individual problem list below for more.  Discharge Diagnoses:  Principal Problem:   Acute on chronic respiratory failure with hypoxia (HCC) Active Problems:   Acute CHF (congestive heart failure) (HCC)   CKD (chronic kidney disease) stage 3, GFR 30-59 ml/min (HCC)   Lupus (HCC)   Hypothyroidism   Cor pulmonale (HCC)  Acute on chronic hypoxemic respiratory failure: Multifactorial primarily diastolic CHF exacerbation.  Also underlying severe interstitial lung disease and OHS.  Aggressively diuresed with IV Lasix drip and metolazone.  Transition to p.o. torsemide and continued to do well.  Discharged on oral torsemide 100 mg twice daily and metolazone 5 mg as needed.  She will continue her home potassium.  Recommend checking BMP at follow-up.  History of lupus/CREST syndrome/severe interstitial lung disease -Continue Plaquenil,  CellCept and prednisone. -Outpatient follow-up with her rheumatologist after this hospitalization  Anasarca: Aggressively diuresed as above. -Diuretics as above  Normocytic anemia of chronic disease: Hemoglobin down to 6.7 and  trended up to 8.6 after 1 unit remained stable -Recommend checking CBC at follow-up.  CKD 3:  Stable -Recheck BMP at follow-up.  Hypothyroidism/history of GERD/hyperlipidemia/insomnia -Continue home meds  Chronic pain on Nucynta and Zanaflex -Continue home meds -Continue home prednisone  Morbid obesity: Difficult case given fluid status, her comorbidities and chronic steroid -Outpatient follow-up -Manage fluid overload as above   Discharge Instructions  Discharge Instructions    Call MD for:  difficulty breathing, headache or visual disturbances   Complete by:  As directed    Call MD for:  persistant dizziness or light-headedness   Complete by:  As directed    Call MD for:  persistant nausea and vomiting   Complete by:  As directed    Call MD for:  severe uncontrolled pain   Complete by:  As directed    Call MD for:  temperature >100.4   Complete by:  As directed    Diet - low sodium heart healthy   Complete by:  As directed    Discharge instructions   Complete by:  As directed    You were admitted for excess fluid buildup in your body.  We removed some excess fluid with fluid medication.  With that, you swelling of breathing improved to the point we think it is safe to let you go home and continue you fluid medication.  We recommend not drinking more than 1200 cc water in a day.  Limit your salt intake to 1.5 g (1500 mg) a day.  Continue taking your fluid pill as prescribed to you.  Please read the directions are you medication before you take them.  Please follow-up with your primary care doctor in 1 week.  We also recommend follow-up with your rheumatologist in 1 to 2 weeks.   Increase activity slowly   Complete by:  As directed      Allergies as of 08/19/2018      Reactions   Nitroglycerin Other (See Comments), Rash   Pt taking sildenafil Pt taking sildenafil   Zolpidem    Other reaction(s): Dizziness (intolerance), Hallucination Other reaction(s): Dizziness  (intolerance)   Enoxaparin Itching   Heparin Itching      Medication List    STOP taking these medications   bumetanide 2 MG tablet Commonly known as:  BUMEX   cephALEXin 500 MG capsule Commonly known as:  KEFLEX     TAKE these medications   albuterol 108 (90 Base) MCG/ACT inhaler Commonly known as:  PROVENTIL HFA;VENTOLIN HFA Inhale 2 puffs into the lungs every 6 (six) hours as needed. For shortness of breath   albuterol (2.5 MG/3ML) 0.083% nebulizer solution Commonly known as:  PROVENTIL Take 2.5 mg by nebulization every 6 (six) hours as needed. For shortness of breath   beclomethasone 80 MCG/ACT inhaler Commonly known as:  QVAR Inhale 1 puff into the lungs 2 (two) times daily. For shortness of  breath   CALCIUM 500 1250 (500 Ca) MG tablet Generic drug:  calcium carbonate Take 1 tablet by mouth 2 (two) times daily.   cetirizine 10 MG tablet Commonly known as:  ZYRTEC Take 10 mg by mouth daily.   colchicine-probenecid 0.5-500 MG tablet Take 1 tablet by mouth daily.   cyanocobalamin 1000 MCG/ML injection Commonly known as:  (VITAMIN B-12) Inject 1,000 mcg into  the muscle every 30 (thirty) days.   febuxostat 40 MG tablet Commonly known as:  ULORIC Take 40 mg by mouth daily.   gabapentin 300 MG capsule Commonly known as:  NEURONTIN Take 900 mg by mouth 2 (two) times daily.   hydroxychloroquine 200 MG tablet Commonly known as:  PLAQUENIL Take 200 mg by mouth 2 (two) times daily.   IRON FORMULA PO Take 1 tablet by mouth daily.   levothyroxine 75 MCG tablet Commonly known as:  SYNTHROID, LEVOTHROID Take 37.5 mcg by mouth daily.   lidocaine 5 % ointment Commonly known as:  XYLOCAINE Apply 2 g topically 4 (four) times daily as needed for pain.   metolazone 5 MG tablet Commonly known as:  ZAROXOLYN Take 5 mg by mouth every other day. If needed for swelling   montelukast 10 MG tablet Commonly known as:  SINGULAIR Take 10 mg by mouth daily.     mycophenolate 200 MG/ML suspension Commonly known as:  CELLCEPT Take 7.5 mLs by mouth 2 (two) times daily.   NUCYNTA 100 MG Tabs Generic drug:  Tapentadol HCl Take 100 mg by mouth every 6 (six) hours as needed for pain.   potassium chloride SA 20 MEQ tablet Commonly known as:  K-DUR,KLOR-CON Take 20 mEq by mouth daily.   predniSONE 5 MG tablet Commonly known as:  DELTASONE Take 7.5 mg by mouth daily. Continuous   ramelteon 8 MG tablet Commonly known as:  ROZEREM Take 8 mg by mouth at bedtime as needed for sleep.   sildenafil 20 MG tablet Commonly known as:  REVATIO Take 20 mg by mouth 3 (three) times daily.   simvastatin 20 MG tablet Commonly known as:  ZOCOR Take 20 mg by mouth daily.   sulfamethoxazole-trimethoprim 800-160 MG tablet Commonly known as:  BACTRIM DS,SEPTRA DS Take 1 tablet by mouth as directed. Three times a week   tacrolimus 0.1 % ointment Commonly known as:  PROTOPIC Apply 1 application topically 2 (two) times daily.   tiZANidine 4 MG tablet Commonly known as:  ZANAFLEX Take 4-12 mg by mouth at bedtime as needed for muscle spasms.   torsemide 20 MG tablet Commonly known as:  DEMADEX Take 5 tablets (100 mg total) by mouth 2 (two) times daily for 30 days.   traZODone 50 MG tablet Commonly known as:  DESYREL Take 50 mg by mouth at bedtime as needed for sleep.   Treprostinil 0.6 MG/ML Soln Commonly known as:  TYVASO Inhale 18 mcg into the lungs 4 (four) times daily.       Consultations:  None  Procedures/Studies:  2D echo-None this visit  Dg Chest 2 View  Result Date: 08/10/2018 CLINICAL DATA:  Chest pain, shortness of breath EXAM: CHEST - 2 VIEW COMPARISON:  CTA chest dated 02/23/2015 FINDINGS: Increased interstitial markings, chronic, likely reflecting chronic interstitial lung disease when correlating with prior CT. No focal consolidation. No pleural effusion or pneumothorax. Cardiomegaly. Degenerative changes of the visualized  thoracolumbar spine. IMPRESSION: Chronic interstitial lung disease. Cardiomegaly. Electronically Signed   By: Julian Hy M.D.   On: 08/10/2018 21:19     Subjective: No major events overnight of this morning.  Reports good urine output.  Denies dyspnea, chest pain or abdominal pain.  Regular bowel movement.   Discharge Exam: Vitals:   08/18/18 2141 08/19/18 0614  BP: 101/66 99/62  Pulse: 88 68  Resp: 16 20  Temp: 98.2 F (36.8 C) 98.4 F (36.9 C)  SpO2:  100%   Filed Weights  08/17/18 0535 08/18/18 0605 08/19/18 0624  Weight: 98 kg 98.4 kg 95.8 kg   GENERAL: No acute distress. HEENT: MMM.  Vision and Hearing grossly intact.  NECK: Supple.  No JVD.  LUNGS:  No IWOB.  Fair air movement bilaterally.  Bibasilar crackles HEART:  RRR. Heart sounds normal. ABD: Bowel sounds present. Soft. Non tender.  EXT: Some nonpitting edema bilaterally SKIN: no apparent skin lesion.  NEURO: Awake, alert and oriented appropriately.  No gross deficit.  PSYCH: Calm. Normal affect.  The results of significant diagnostics from this hospitalization (including imaging, microbiology, ancillary and laboratory) are listed below for reference.     Microbiology: No results found for this or any previous visit (from the past 240 hour(s)).   Labs: BNP (last 3 results) Recent Labs    08/10/18 2354  BNP 3,382.5*   Basic Metabolic Panel: Recent Labs  Lab 08/14/18 0412 08/15/18 0423 08/16/18 0353 08/17/18 0500 08/18/18 0356 08/19/18 0451  NA 134* 134* 136 136 136 136  K 3.6 3.5 3.7 3.7 3.6 3.4*  CL 94* 95* 96* 97* 95* 95*  CO2 _0 GLUCOSE 108* 106* 103* 84 93 94  BUN 52* 52* 48* 47* 47* 47*  CREATININE 1.39* 1.43* 1.35* 1.30* 1.42* 1.34*  CALCIUM 9.2 9.0 9.2 9.3 9.1 9.1  MG 2.0  --  2.2 2.2 2.3 2.2  PHOS 3.2  --  3.5  --   --   --    Liver Function Tests: Recent Labs  Lab 08/14/18 0412 08/16/18 0353  ALBUMIN 4.1 4.0   No results for input(s): LIPASE, AMYLASE  in the last 168 hours. No results for input(s): AMMONIA in the last 168 hours. CBC: Recent Labs  Lab 08/13/18 0424 08/14/18 1004 08/15/18 0423 08/16/18 0855 08/16/18 2028 08/17/18 0500 08/18/18 0356 08/19/18 0451  WBC 6.7 7.0 8.0  --   --   --   --   --   NEUTROABS 5.0  --  6.4  --   --   --   --   --   HGB 8.1* 7.7* 7.4* 6.7* 8.6* 8.8* 9.0* 8.5*  HCT 27.4* 26.5* 25.0* 22.9* 28.6* 29.7* 31.3* 29.1*  MCV 91.3 93.6 91.2  --   --   --   --   --   PLT 310 277 299  --   --   --   --   --    Cardiac Enzymes: No results for input(s): CKTOTAL, CKMB, CKMBINDEX, TROPONINI in the last 168 hours. BNP: Invalid input(s): POCBNP CBG: No results for input(s): GLUCAP in the last 168 hours. D-Dimer No results for input(s): DDIMER in the last 72 hours. Hgb A1c No results for input(s): HGBA1C in the last 72 hours. Lipid Profile No results for input(s): CHOL, HDL, LDLCALC, TRIG, CHOLHDL, LDLDIRECT in the last 72 hours. Thyroid function studies No results for input(s): TSH, T4TOTAL, T3FREE, THYROIDAB in the last 72 hours.  Invalid input(s): FREET3 Anemia work up No results for input(s): VITAMINB12, FOLATE, FERRITIN, TIBC, IRON, RETICCTPCT in the last 72 hours. Urinalysis    Component Value Date/Time   COLORURINE YELLOW 02/23/2015 2313   APPEARANCEUR CLEAR 02/23/2015 2313   LABSPEC 1.014 02/23/2015 2313   PHURINE 6.0 02/23/2015 2313   GLUCOSEU NEGATIVE 02/23/2015 2313   HGBUR NEGATIVE 02/23/2015 2313   BILIRUBINUR NEGATIVE 02/23/2015 2313   KETONESUR NEGATIVE 02/23/2015 2313   PROTEINUR NEGATIVE 02/23/2015 2313   UROBILINOGEN 0.2 02/23/2015 2313   NITRITE NEGATIVE 02/23/2015  [place] (A) 02/23/2015 2313   Sepsis Labs Invalid input(s): PROCALCITONIN,  WBC,  LACTICIDVEN   Time coordinating discharge: 45 minutes  SIGNED:  Mercy Riding, MD  [facility] 08/19/2018, 11:19 AM Pager [phone]  If 7PM-7AM, please contact  night-coverage [email or web address] Password [email or web address]

## 2019-03-27 DEATH — deceased

## 2019-08-05 IMAGING — CR DG CHEST 2V
2 series · 2 of 2 positions shown · non-contrast
Comparison: CTA chest dated 02/23/2015

CLINICAL DATA: Chest pain, shortness of breath

EXAM:
CHEST - 2 VIEW

[w chest lat]
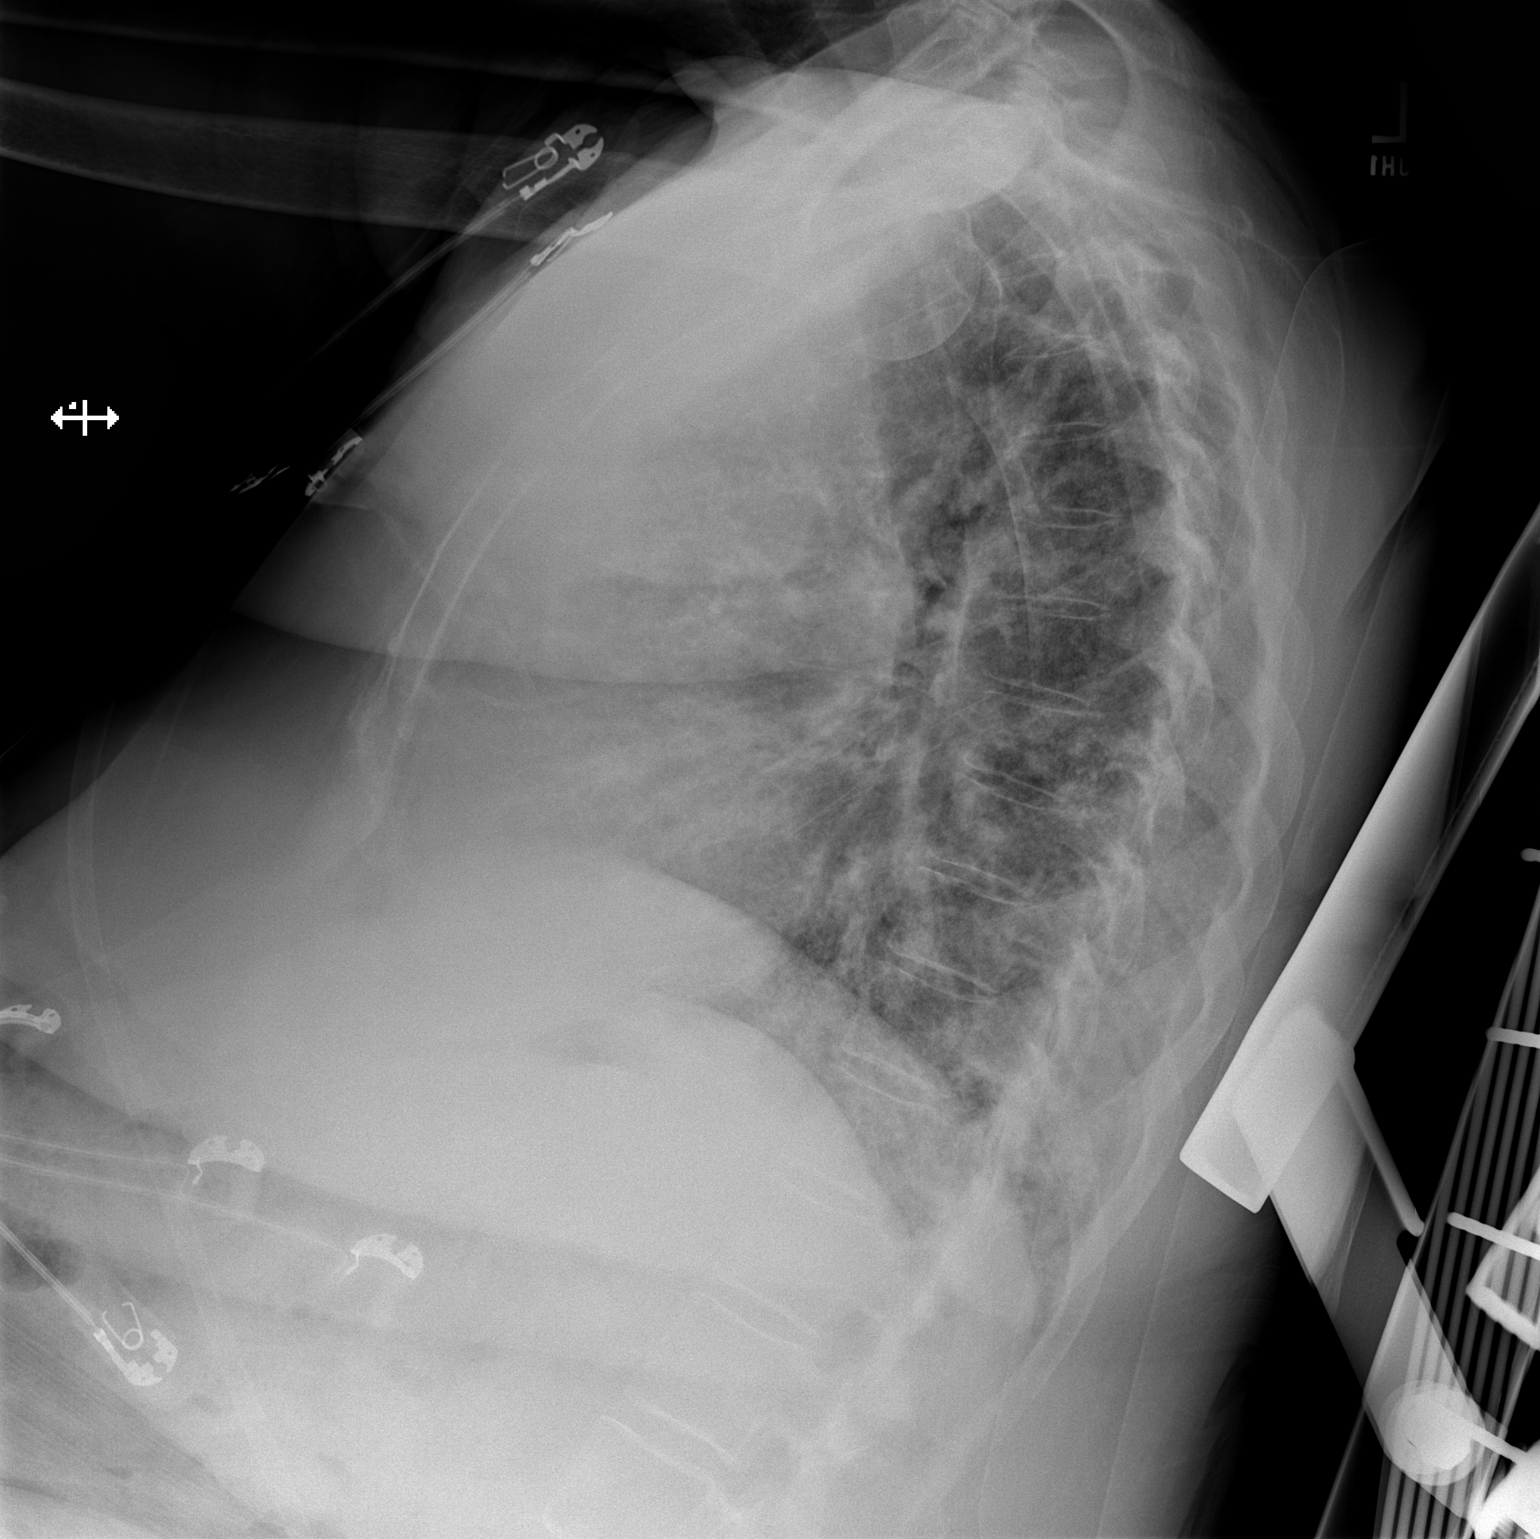

[x chest ap]
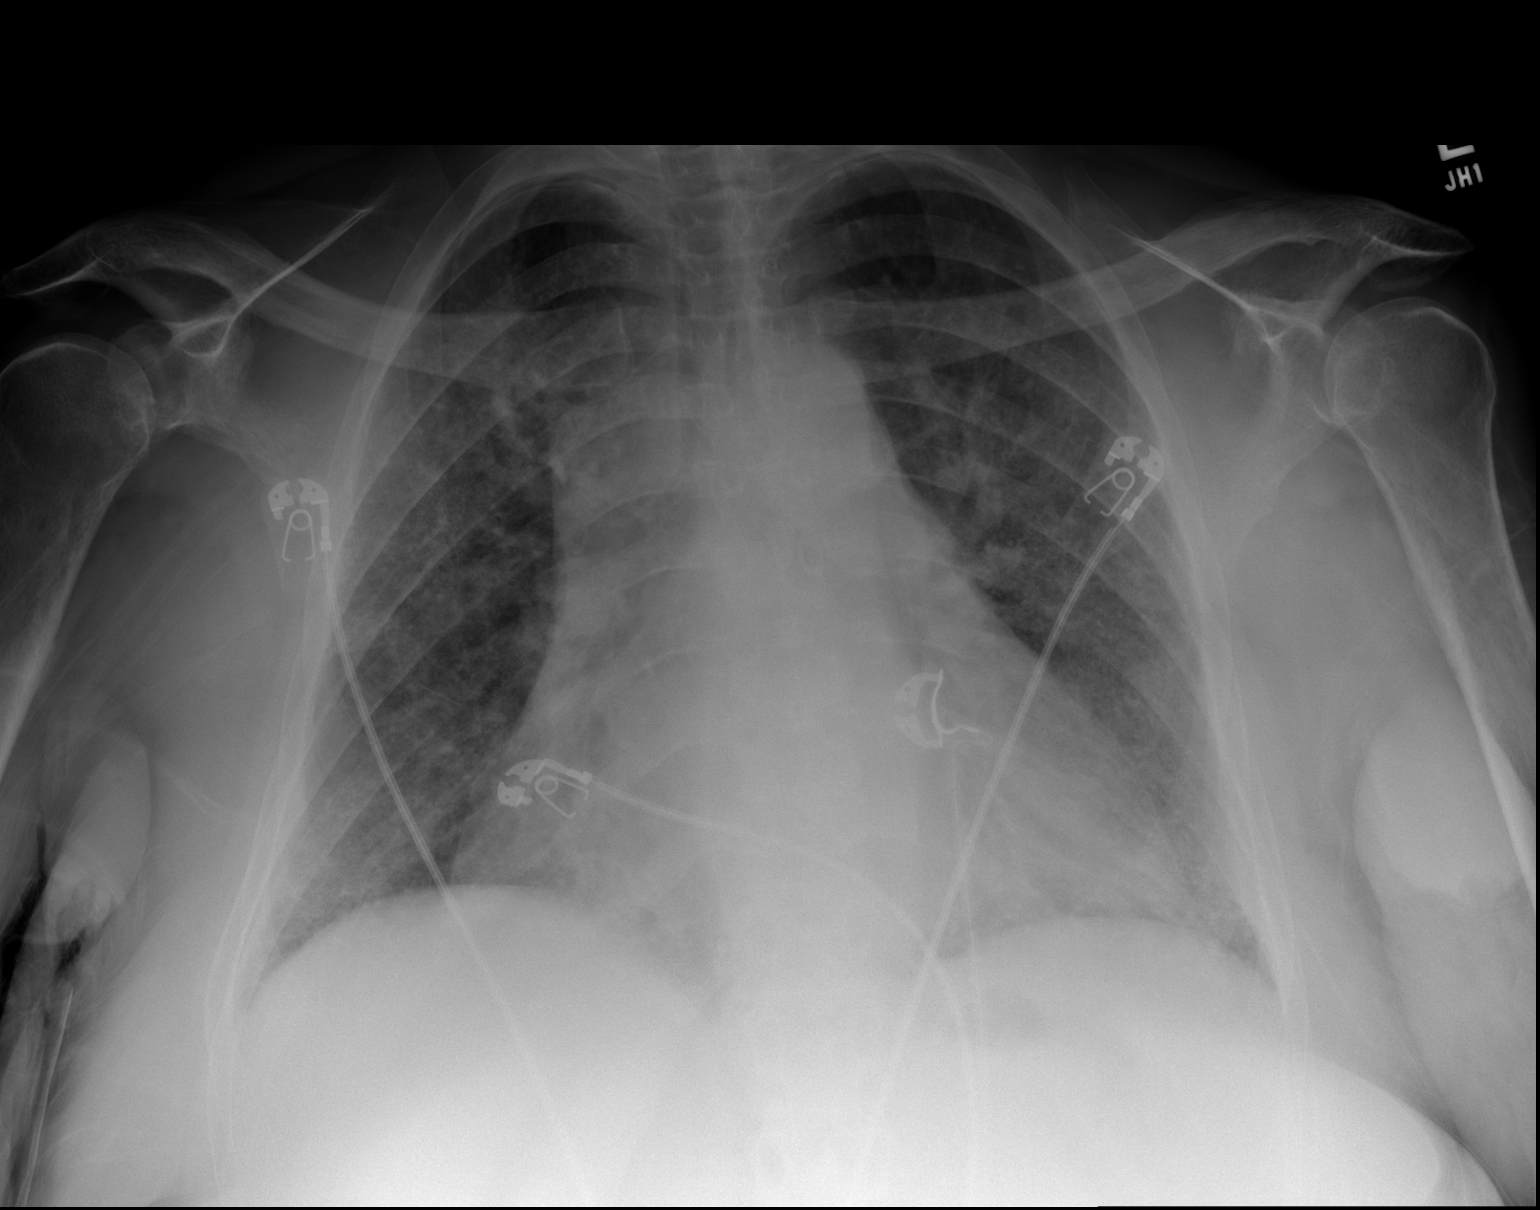

[2 of 2 positions shown; findings below may reference images not displayed]

FINDINGS: Increased interstitial markings, chronic, likely reflecting chronic
interstitial lung disease when correlating with prior CT. No focal
consolidation. No pleural effusion or pneumothorax.

Cardiomegaly.

Degenerative changes of the visualized thoracolumbar spine.
IMPRESSION: Chronic interstitial lung disease.

Cardiomegaly.
